# Patient Record
Sex: Female | Born: 1970 | Race: White | Hispanic: No | Marital: Married | Smoking: Never smoker
Health system: Southern US, Community
[De-identification: ages and names within clinical notes are randomized; demographics above are authoritative.]

## PROBLEM LIST (undated history)

## (undated) DIAGNOSIS — N979 Female infertility, unspecified: Secondary | ICD-10-CM

## (undated) DIAGNOSIS — I341 Nonrheumatic mitral (valve) prolapse: Secondary | ICD-10-CM

## (undated) DIAGNOSIS — N809 Endometriosis, unspecified: Secondary | ICD-10-CM

## (undated) DIAGNOSIS — C801 Malignant (primary) neoplasm, unspecified: Secondary | ICD-10-CM

## (undated) DIAGNOSIS — R87619 Unspecified abnormal cytological findings in specimens from cervix uteri: Secondary | ICD-10-CM

## (undated) DIAGNOSIS — E162 Hypoglycemia, unspecified: Secondary | ICD-10-CM

## (undated) DIAGNOSIS — T7840XA Allergy, unspecified, initial encounter: Secondary | ICD-10-CM

## (undated) DIAGNOSIS — R32 Unspecified urinary incontinence: Secondary | ICD-10-CM

## (undated) DIAGNOSIS — G5603 Carpal tunnel syndrome, bilateral upper limbs: Secondary | ICD-10-CM

## (undated) DIAGNOSIS — Z5189 Encounter for other specified aftercare: Secondary | ICD-10-CM

## (undated) DIAGNOSIS — G8 Cerebral palsy: Secondary | ICD-10-CM

## (undated) DIAGNOSIS — F419 Anxiety disorder, unspecified: Secondary | ICD-10-CM

## (undated) DIAGNOSIS — D649 Anemia, unspecified: Secondary | ICD-10-CM

## (undated) DIAGNOSIS — O43219 Placenta accreta, unspecified trimester: Secondary | ICD-10-CM

## (undated) HISTORY — PX: WISDOM TOOTH EXTRACTION: SHX21

## (undated) HISTORY — DX: Carpal tunnel syndrome, bilateral upper limbs: G56.03

## (undated) HISTORY — DX: Anemia, unspecified: D64.9

## (undated) HISTORY — DX: Allergy, unspecified, initial encounter: T78.40XA

## (undated) HISTORY — DX: Anxiety disorder, unspecified: F41.9

## (undated) HISTORY — PX: OTHER SURGICAL HISTORY: SHX169

## (undated) HISTORY — DX: Female infertility, unspecified: N97.9

## (undated) HISTORY — DX: Nonrheumatic mitral (valve) prolapse: I34.1

## (undated) HISTORY — DX: Endometriosis, unspecified: N80.9

## (undated) HISTORY — DX: Unspecified abnormal cytological findings in specimens from cervix uteri: R87.619

## (undated) HISTORY — DX: Hypoglycemia, unspecified: E16.2

## (undated) HISTORY — PX: PELVIC LAPAROSCOPY: SHX162

## (undated) HISTORY — PX: BARTHOLIN GLAND CYST EXCISION: SHX565

## (undated) HISTORY — DX: Encounter for other specified aftercare: Z51.89

## (undated) HISTORY — DX: Unspecified urinary incontinence: R32

## (undated) HISTORY — DX: Malignant (primary) neoplasm, unspecified: C80.1

---

## 1998-05-28 ENCOUNTER — Encounter: Admission: RE | Admit: 1998-05-28 | Discharge: 1998-08-26 | Payer: Self-pay | Admitting: *Deleted

## 1998-11-10 ENCOUNTER — Encounter: Payer: Self-pay | Admitting: Gynecology

## 1998-11-10 ENCOUNTER — Ambulatory Visit (HOSPITAL_COMMUNITY): Admission: RE | Admit: 1998-11-10 | Discharge: 1998-11-10 | Payer: Self-pay | Admitting: Gynecology

## 1998-12-04 HISTORY — PX: LAPAROSCOPIC ENDOMETRIOSIS FULGURATION: SUR769

## 1999-09-26 ENCOUNTER — Other Ambulatory Visit: Admission: RE | Admit: 1999-09-26 | Discharge: 1999-09-26 | Payer: Self-pay | Admitting: Obstetrics and Gynecology

## 2000-01-05 ENCOUNTER — Inpatient Hospital Stay (HOSPITAL_COMMUNITY): Admission: AD | Admit: 2000-01-05 | Discharge: 2000-01-05 | Payer: Self-pay | Admitting: Obstetrics and Gynecology

## 2000-01-07 ENCOUNTER — Inpatient Hospital Stay (HOSPITAL_COMMUNITY): Admission: AD | Admit: 2000-01-07 | Discharge: 2000-01-07 | Payer: Self-pay | Admitting: Obstetrics and Gynecology

## 2000-01-17 ENCOUNTER — Inpatient Hospital Stay (HOSPITAL_COMMUNITY): Admission: AD | Admit: 2000-01-17 | Discharge: 2000-01-17 | Payer: Self-pay | Admitting: Obstetrics and Gynecology

## 2000-02-03 ENCOUNTER — Inpatient Hospital Stay (HOSPITAL_COMMUNITY): Admission: AD | Admit: 2000-02-03 | Discharge: 2000-02-03 | Payer: Self-pay | Admitting: Obstetrics & Gynecology

## 2000-02-04 ENCOUNTER — Inpatient Hospital Stay (HOSPITAL_COMMUNITY): Admission: AD | Admit: 2000-02-04 | Discharge: 2000-02-04 | Payer: Self-pay | Admitting: Obstetrics and Gynecology

## 2000-02-08 ENCOUNTER — Encounter (INDEPENDENT_AMBULATORY_CARE_PROVIDER_SITE_OTHER): Payer: Self-pay

## 2000-02-08 ENCOUNTER — Inpatient Hospital Stay (HOSPITAL_COMMUNITY): Admission: AD | Admit: 2000-02-08 | Discharge: 2000-02-11 | Payer: Self-pay | Admitting: Obstetrics and Gynecology

## 2000-02-08 ENCOUNTER — Encounter: Payer: Self-pay | Admitting: Obstetrics and Gynecology

## 2000-02-12 ENCOUNTER — Encounter: Admission: RE | Admit: 2000-02-12 | Discharge: 2000-04-18 | Payer: Self-pay | Admitting: Obstetrics and Gynecology

## 2000-03-15 ENCOUNTER — Other Ambulatory Visit: Admission: RE | Admit: 2000-03-15 | Discharge: 2000-03-15 | Payer: Self-pay | Admitting: Obstetrics and Gynecology

## 2000-04-25 ENCOUNTER — Encounter: Admission: RE | Admit: 2000-04-25 | Discharge: 2000-07-24 | Payer: Self-pay | Admitting: Obstetrics and Gynecology

## 2001-03-26 ENCOUNTER — Other Ambulatory Visit: Admission: RE | Admit: 2001-03-26 | Discharge: 2001-03-26 | Payer: Self-pay | Admitting: Gynecology

## 2001-12-04 DIAGNOSIS — R87619 Unspecified abnormal cytological findings in specimens from cervix uteri: Secondary | ICD-10-CM

## 2001-12-04 DIAGNOSIS — Z5189 Encounter for other specified aftercare: Secondary | ICD-10-CM

## 2001-12-04 HISTORY — DX: Unspecified abnormal cytological findings in specimens from cervix uteri: R87.619

## 2001-12-04 HISTORY — DX: Encounter for other specified aftercare: Z51.89

## 2002-03-14 ENCOUNTER — Ambulatory Visit (HOSPITAL_COMMUNITY): Admission: RE | Admit: 2002-03-14 | Discharge: 2002-03-14 | Payer: Self-pay

## 2002-03-24 ENCOUNTER — Encounter (HOSPITAL_COMMUNITY): Admission: RE | Admit: 2002-03-24 | Discharge: 2002-04-23 | Payer: Self-pay | Admitting: Obstetrics and Gynecology

## 2002-03-31 ENCOUNTER — Inpatient Hospital Stay: Admission: AD | Admit: 2002-03-31 | Discharge: 2002-03-31 | Payer: Self-pay | Admitting: *Deleted

## 2002-04-04 ENCOUNTER — Inpatient Hospital Stay (HOSPITAL_COMMUNITY): Admission: AD | Admit: 2002-04-04 | Discharge: 2002-04-04 | Payer: Self-pay | Admitting: *Deleted

## 2002-04-18 ENCOUNTER — Inpatient Hospital Stay (HOSPITAL_COMMUNITY): Admission: AD | Admit: 2002-04-18 | Discharge: 2002-04-18 | Payer: Self-pay | Admitting: *Deleted

## 2002-04-25 ENCOUNTER — Encounter (HOSPITAL_COMMUNITY): Admission: RE | Admit: 2002-04-25 | Discharge: 2002-05-25 | Payer: Self-pay | Admitting: *Deleted

## 2002-04-25 ENCOUNTER — Encounter: Payer: Self-pay | Admitting: *Deleted

## 2002-05-24 ENCOUNTER — Encounter: Payer: Self-pay | Admitting: *Deleted

## 2002-05-24 ENCOUNTER — Inpatient Hospital Stay (HOSPITAL_COMMUNITY): Admission: AD | Admit: 2002-05-24 | Discharge: 2002-05-26 | Payer: Self-pay | Admitting: *Deleted

## 2002-05-26 ENCOUNTER — Inpatient Hospital Stay (HOSPITAL_COMMUNITY): Admission: AD | Admit: 2002-05-26 | Discharge: 2002-05-29 | Payer: Self-pay | Admitting: *Deleted

## 2002-06-03 ENCOUNTER — Encounter (HOSPITAL_COMMUNITY): Admission: RE | Admit: 2002-06-03 | Discharge: 2002-07-03 | Payer: Self-pay | Admitting: *Deleted

## 2002-06-15 ENCOUNTER — Inpatient Hospital Stay (HOSPITAL_COMMUNITY): Admission: AD | Admit: 2002-06-15 | Discharge: 2002-06-15 | Payer: Self-pay | Admitting: *Deleted

## 2002-07-08 ENCOUNTER — Encounter (HOSPITAL_COMMUNITY): Admission: RE | Admit: 2002-07-08 | Discharge: 2002-08-07 | Payer: Self-pay | Admitting: *Deleted

## 2002-07-22 ENCOUNTER — Encounter: Payer: Self-pay | Admitting: *Deleted

## 2002-08-01 ENCOUNTER — Inpatient Hospital Stay: Admission: AD | Admit: 2002-08-01 | Discharge: 2002-08-01 | Payer: Self-pay | Admitting: Obstetrics and Gynecology

## 2002-08-11 ENCOUNTER — Encounter: Payer: Self-pay | Admitting: *Deleted

## 2002-08-14 ENCOUNTER — Inpatient Hospital Stay (HOSPITAL_COMMUNITY): Admission: AD | Admit: 2002-08-14 | Discharge: 2002-08-14 | Payer: Self-pay | Admitting: *Deleted

## 2002-08-26 ENCOUNTER — Encounter: Payer: Self-pay | Admitting: *Deleted

## 2002-08-26 ENCOUNTER — Encounter (HOSPITAL_COMMUNITY): Admission: RE | Admit: 2002-08-26 | Discharge: 2002-09-25 | Payer: Self-pay | Admitting: *Deleted

## 2002-09-02 ENCOUNTER — Encounter: Payer: Self-pay | Admitting: *Deleted

## 2002-09-09 ENCOUNTER — Encounter: Payer: Self-pay | Admitting: *Deleted

## 2002-09-16 ENCOUNTER — Encounter: Payer: Self-pay | Admitting: *Deleted

## 2002-09-23 ENCOUNTER — Encounter: Payer: Self-pay | Admitting: *Deleted

## 2002-09-30 ENCOUNTER — Encounter (HOSPITAL_COMMUNITY): Admission: RE | Admit: 2002-09-30 | Discharge: 2002-10-10 | Payer: Self-pay | Admitting: *Deleted

## 2002-09-30 ENCOUNTER — Encounter: Payer: Self-pay | Admitting: *Deleted

## 2002-10-07 ENCOUNTER — Encounter: Payer: Self-pay | Admitting: *Deleted

## 2002-10-14 ENCOUNTER — Encounter: Payer: Self-pay | Admitting: *Deleted

## 2002-10-14 ENCOUNTER — Inpatient Hospital Stay (HOSPITAL_COMMUNITY): Admission: AD | Admit: 2002-10-14 | Discharge: 2002-10-19 | Payer: Self-pay | Admitting: *Deleted

## 2002-10-16 ENCOUNTER — Encounter (INDEPENDENT_AMBULATORY_CARE_PROVIDER_SITE_OTHER): Payer: Self-pay

## 2002-11-18 ENCOUNTER — Encounter (INDEPENDENT_AMBULATORY_CARE_PROVIDER_SITE_OTHER): Payer: Self-pay | Admitting: *Deleted

## 2002-11-18 ENCOUNTER — Inpatient Hospital Stay (HOSPITAL_COMMUNITY): Admission: AD | Admit: 2002-11-18 | Discharge: 2002-11-18 | Payer: Self-pay | Admitting: *Deleted

## 2002-12-11 ENCOUNTER — Encounter: Admission: RE | Admit: 2002-12-11 | Discharge: 2003-01-10 | Payer: Self-pay | Admitting: *Deleted

## 2004-03-16 ENCOUNTER — Other Ambulatory Visit: Admission: RE | Admit: 2004-03-16 | Discharge: 2004-03-16 | Payer: Self-pay | Admitting: Gynecology

## 2005-04-10 ENCOUNTER — Other Ambulatory Visit: Admission: RE | Admit: 2005-04-10 | Discharge: 2005-04-10 | Payer: Self-pay | Admitting: Gynecology

## 2005-04-17 ENCOUNTER — Encounter: Admission: RE | Admit: 2005-04-17 | Discharge: 2005-04-17 | Payer: Self-pay | Admitting: Internal Medicine

## 2005-12-25 ENCOUNTER — Emergency Department (HOSPITAL_COMMUNITY): Admission: EM | Admit: 2005-12-25 | Discharge: 2005-12-25 | Payer: Self-pay | Admitting: Emergency Medicine

## 2006-04-09 ENCOUNTER — Other Ambulatory Visit: Admission: RE | Admit: 2006-04-09 | Discharge: 2006-04-09 | Payer: Self-pay | Admitting: Gynecology

## 2007-03-27 ENCOUNTER — Other Ambulatory Visit: Admission: RE | Admit: 2007-03-27 | Discharge: 2007-03-27 | Payer: Self-pay | Admitting: Gynecology

## 2008-06-11 ENCOUNTER — Other Ambulatory Visit: Admission: RE | Admit: 2008-06-11 | Discharge: 2008-06-11 | Payer: Self-pay | Admitting: Gynecology

## 2008-10-01 ENCOUNTER — Ambulatory Visit: Payer: Self-pay | Admitting: Internal Medicine

## 2008-10-02 ENCOUNTER — Ambulatory Visit: Payer: Self-pay | Admitting: Internal Medicine

## 2008-11-19 ENCOUNTER — Ambulatory Visit: Payer: Self-pay | Admitting: Internal Medicine

## 2009-09-17 ENCOUNTER — Ambulatory Visit: Payer: Self-pay | Admitting: Internal Medicine

## 2009-10-15 ENCOUNTER — Ambulatory Visit: Payer: Self-pay | Admitting: Internal Medicine

## 2010-01-12 ENCOUNTER — Emergency Department (HOSPITAL_COMMUNITY): Admission: EM | Admit: 2010-01-12 | Discharge: 2010-01-12 | Payer: Self-pay | Admitting: Emergency Medicine

## 2010-03-25 ENCOUNTER — Ambulatory Visit: Payer: Self-pay | Admitting: Internal Medicine

## 2010-12-22 ENCOUNTER — Ambulatory Visit
Admission: RE | Admit: 2010-12-22 | Discharge: 2010-12-22 | Payer: Self-pay | Source: Home / Self Care | Attending: Internal Medicine | Admitting: Internal Medicine

## 2011-04-21 NOTE — Op Note (Signed)
[name], [name]                            ACCOUNT NO.:  [id number]   MEDICAL RECORD NO.:  [id number]                   PATIENT TYPE:  INP   LOCATION:  9179                                 FACILITY:  [facility]   PHYSICIAN:  Tanya S. Shawnie Pons, M.D.                DATE OF BIRTH:  03-15-1971   DATE OF PROCEDURE:  10/15/2002  DATE OF DISCHARGE:                                 OPERATIVE REPORT   PREOPERATIVE DIAGNOSIS:  Postpartum hemorrhage.   POSTOPERATIVE DIAGNOSIS:  Postpartum hemorrhage.   PROCEDURE:  Suction dilatation and curettage and second degree perineal tear  repair.   SURGEON:  Shelbie Proctor. Shawnie Pons, M.D.   ANESTHESIA:  Epidural and IV sedation.   FINDINGS:  A boggy uterus with retained placenta.   ESTIMATED BLOOD LOSS:  Approximately 4000 cc.   COMPLICATIONS:  Postpartum hemorrhage.   SPECIMENS:  Placenta to pathology.   INDICATION FOR PROCEDURE:  The patient is a 40 year old G2, P1, who is for  induction of labor for decreased fetal movement.  She received IV Pitocin  all day and progressed well to complete.  She pushed for approximately one  hour and had a spontaneous vaginal delivery of a viable female infant with  Apgars of 9 and 9, weight 6 pounds 1 ounce, over a second degree perineal  tear.  Post delivery the placenta began to separate and came out with gentle  traction.  The placenta delivered; however, it was felt that a portion of  placenta probably remained in, and the uterine cavity was explored and found  to have quite a bit of retained placenta that was markedly adherent to the  anterior wall.  Several attempts were made to remove the placenta manually.  When this failed, a curette was then passed in the delivery room several  times without much success in returning tissue.  The uterus was bimanually  massaged, and the patient was given IM Methergine in the room as well as  Pitocin.  A Foley was placed inside the room, and blood was ordered.  The  patient had  a second IV started and received approximately 3 L of fluid  while still in the delivery room.  The decision was then made due to  uncontrollable hemorrhage to go to the operating room.   DESCRIPTION OF PROCEDURE:  The patient was taken to the OR and placed in  dorsal lithotomy in Ida stirrups.  She was prepped and draped in the usual  sterile fashion and the uterus was massaged.  Brisk bleeding was noted, and  the anterior lip of the cervix was grasped with a ring forceps.  A sharp,  large curette was then passed through the cervix into the uterine cavity,  and the uterus was cleared of all retained placenta.  Gerri Spore B. Earlene Plater, M.D.,  actually did come in and did an ultrasound of the uterus and saw a  nice,  thin stripe, but the patient continued to bleed.  The patient then received  IM Hemabate as well as 400 of Cytotec.  The midline episiotomy was repaired  with some 2-0 Vicryl in a running fashion.  The uterus continued to be firm  but when massaged gave a large amount of blood that still appeared to be  clotting.  A second dose of Hemabate was given.  This went on for two  minutes longer, and another 200 of Cytotec was given per rectum.  Continual  massage was done and another dose of Hemabate given until the bleeding was  down to a fairly slow trickle.  The midline episiotomy repair was broken  down and had to be repaired again with a 2-0 Vicryl suture.  The patient  received four units of packed red blood cells intraoperatively and two units  of FFP in the PACU.  Her pulse went from the 150s down to the 120s, her  blood pressure came up from 70s/30s to 110s/50s at the time of transfer to  the PACU.  All instrument, needle, and lap counts were correct x2.  Any  tissue removed was sent to pathology.  The patient was taken to the recovery  room and transferred to the AICU in critical condition.                                               Shelbie Proctor. Shawnie Pons, M.D.    TSP/MEDQ  D:   10/15/2002  T:  10/16/2002  Job:  [id number]

## 2011-04-21 NOTE — Discharge Summary (Signed)
   [name], [name]                            ACCOUNT NO.:  [id number]   MEDICAL RECORD NO.:  [id number]                   PATIENT TYPE:   LOCATION:                                       [facility]ILITY:  [facility]   PHYSICIAN:  Conni Elliot, M.D.             DATE OF BIRTH:  07/24/1971   DATE OF ADMISSION:  05/23/2002  DATE OF DISCHARGE:  05/29/2002                                 DISCHARGE SUMMARY   HISTORY OF PRESENT ILLNESS:  A 40 year old gravida 2 para 0-1-0 at 33 to [redacted]  weeks gestation well dated by my first trimester assessment.  The patient  presents complaining of five to six contractions per hour.  There has been  no rupture of membranes and no bleeding.   PHYSICAL EXAMINATION ON ADMISSION:  VITAL SIGNS:  Temperature 99.1, pulse  77, respirations 20, blood pressure 108/73.  GENERAL:  The patient is a well-developed, well-nourished female.  HEART:  NSR.  LUNGS:  Clear.  CERVIX:  Long, fingertip external os, no pressure on stitch.  ABDOMEN:  Fetal heart rate was 150-160.   HOSPITAL COURSE:  The patient is 17 to 18 weeks and is admitted for  cervicitis and ascending infection.  The patient was placed on IV  antibiotics and Motrin.  Her baby aspirin was continued.  The patient did  require morphine injection once for pain; however, she responded to the  Motrin and antibiotics.  The Motrin was discontinued on May 28, 2002 and  she was felt to be ready for discharge on May 29, 2002.   DISPOSITION:  The patient was instructed to return home to bedrest and to  return to see Dr. Gavin Potters on June 03, 2002.                                               Conni Elliot, M.D.    ASG/MEDQ  D:  10/09/2002  T:  10/10/2002  Job:  [id number]

## 2011-04-21 NOTE — Discharge Summary (Signed)
NAMEBRISSIA, [name]                            ACCOUNT NO.:  [id number]   MEDICAL RECORD NO.:  [id number]                   PATIENT TYPE:  INP   LOCATION:  9131                                 [facility]ACILITY:  [facility]   PHYSICIAN:  Mary Sella. Orlene Erm, M.D.                 DATE OF BIRTH:  11-Jan-1971   DATE OF ADMISSION:  10/14/2002  DATE OF DISCHARGE:  10/19/2002                                 DISCHARGE SUMMARY   REASON FOR ADMISSION:  Induction of labor.   PRINCIPAL DIAGNOSES:  A 40 year old gravida 2, para 0-1-0-1 at 37 weeks and  2 days with decreased fetal activity.   ADDITIONAL DIAGNOSES:  1. History of preterm labor.  2. History of cervical cerclage.  3. Postpartum hemorrhage.  4. 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   HOSPITAL COURSE:  The patient is a 40 year old G2, P0-1-0-1 who presents to  labor and delivery at 37 weeks and 2 days.  Has been followed by Conni Elliot, M.D. for a history of preterm delivery.  She was admitted for  induction of labor secondary to decreased fetal activity.  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  It was removed approximately one week prior to admission.  The patient was admitted to labor and delivery and begun induction of labor  with Cervidil.  She progressed in labor with contractions up to 5-6 cm, 50%,  and -2.  She had artificial rupture of membranes performed.  IUPC was placed  and patient progressed in labor to complete complete and +1.  She began  pushing and delivered a viable female infant at 64 on December 15, 2001.  The placenta delivered spontaneously at 1847 and was noted to have tearing  of the placenta and retained placenta.  The patient was hemorrhaging and was  taken to the OR for dilatation and curettage.  The patient underwent  dilatation and curettage and received Hemabate, Methergine, and Cytotec  intraoperatively.  She received 4 units of packed red blood cells  and 2  units of FFP in the OR.  Estimated blood loss for delivery was approximately  4000 cubic centimeters.  The patient recovered in the adult intensive care  unit.  The patient continued to be anemic and was given 2 more units of  packed red blood cells.  Her bleeding was significantly less after her D&C.  The patient continued to do well and on postpartum day number two was  afebrile with vital signs stable.  At that point her hemoglobin was 5.7,  hematocrit 18.8, platelets 86,000.  At that point she received her fifth and  sixth unit of packed red blood cells.  Postpartum day number four the  patient was doing well with stable hemoglobin and hematocrit.  She was  discharged home in stable condition.   DISCHARGE DIET:  Regular.  DISCHARGE ACTIVITIES:  Pelvic rest.   DISCHARGE MEDICATIONS:  1. Vicodin.  2. Motrin.  3. Iron sulfate.   FOLLOW UP:  The patient was to follow up with Conni Elliot, M.D. in  approximately six weeks for postpartum examination.   LABORATORIES:  The day of discharge patient's white blood cell count was  7.4, hemoglobin 8.1, hematocrit 23.3, and platelets 133,000.   DISCHARGE DIAGNOSES:  1. Term pregnancy, delivered.  2. Placenta accreta.  3. Postpartum hemorrhage.  4. Anemia.                                               Mary Sella. Orlene Erm, M.D.    EMH/MEDQ  D:  12/18/2002  T:  12/18/2002  Job:  [id number]

## 2011-04-21 NOTE — Discharge Summary (Signed)
   [name], [name]                            ACCOUNT NO.:  [id number]   MEDICAL RECORD NO.:  [id number]                   PATIENT TYPE:  MAT   LOCATION:  [facility]C                                 [facility]ILITY:  [facility]   PHYSICIAN:  Conni Elliot, M.D.             DATE OF BIRTH:  Nov 04, 1971   DATE OF ADMISSION:  05/24/2002  DATE OF DISCHARGE:  05/26/2002                                 DISCHARGE SUMMARY   HISTORY OF PRESENT ILLNESS:  The patient was seen for a prenatal visit and  was found to have premature cervical change at 17-18 weeks with history of  prior premature birth.  The patient is currently on baby aspirin and  __________.   PHYSICAL EXAMINATION:  Weight 187, pulse 81, respirations 22, blood pressure  121/77.  The cervix on visual is external os 1, internal os fingertip with  palpable lower uterine segment.  GBS was negative.   HOSPITAL COURSE:  On May 16, 2002, the patient was admitted and placed on  Motrin, baby aspirin and IV Unasyn.  The patient was taken to the operating  room on May 25, 2002, and had a cervical cerclage.  The patient had an  uneventful postoperative course and was felt to be ready for discharge on  May 26, 2002.                                               Conni Elliot, M.D.    ASG/MEDQ  D:  08/24/2002  T:  08/25/2002  Job:  [id number]

## 2011-04-21 NOTE — Op Note (Signed)
   NAMEAILEENA, IGLESIA                            ACCOUNT NO.:  [id number]   MEDICAL RECORD NO.:  [id number]                   PATIENT TYPE:  NP   LOCATION:  9128                                 [facility]ACILITY:  [facility]   PHYSICIAN:  Conni Elliot, M.D.             DATE OF BIRTH:  [birth year]   DATE OF PROCEDURE:  05/25/2002  DATE OF DISCHARGE:  05/26/2002                                 OPERATIVE REPORT   PREOPERATIVE DIAGNOSIS:  Decreased cervical resistance.   POSTOPERATIVE DIAGNOSIS:  Decreased cervical resistance.   PROCEDURE:  McDonald cerclage.   SURGEON:  Conni Elliot, M.D.   ANESTHESIA:  Spinal.   ESTIMATED BLOOD LOSS:  Less than 5 cc.   DESCRIPTION OF PROCEDURE:  After placing the patient in the dorsal supine  after receiving a spinal anesthetic, the perineum and vagina were prepped  with multiple Betadine solution.  A Foley catheter was placed to straight  drainage, a weighted speculum was placed in the posterior vagina, and the  anterior cervix was grasped with a sponge stick.  After placing a Cleocin  douche, a cervical cerclage was placed starting at 12 o'clock using number 4  silk, double-stranded.  A suture was placed counterclockwise and tied  anterior at 12 o'clock.                                               Conni Elliot, M.D.    ASG/MEDQ  D:  07/01/2002  T:  07/03/2002  Job:  [id number]

## 2011-07-10 ENCOUNTER — Encounter: Payer: Self-pay | Admitting: Internal Medicine

## 2011-07-11 ENCOUNTER — Other Ambulatory Visit: Payer: Self-pay | Admitting: Internal Medicine

## 2011-07-11 ENCOUNTER — Other Ambulatory Visit: Payer: BC Managed Care – PPO | Admitting: Internal Medicine

## 2011-07-11 DIAGNOSIS — Z0000 Encounter for general adult medical examination without abnormal findings: Secondary | ICD-10-CM

## 2011-07-11 LAB — CBC WITH DIFFERENTIAL/PLATELET
Basophils Absolute: 0 10*3/uL (ref 0.0–0.1)
Eosinophils Absolute: 0.1 10*3/uL (ref 0.0–0.7)
Eosinophils Relative: 1 % (ref 0–5)
HCT: 43.9 % (ref 36.0–46.0)
MCH: 29.5 pg (ref 26.0–34.0)
MCV: 93.2 fL (ref 78.0–100.0)
Monocytes Absolute: 0.4 10*3/uL (ref 0.1–1.0)
Platelets: 172 10*3/uL (ref 150–400)
RDW: 13.3 % (ref 11.5–15.5)

## 2011-07-11 LAB — TSH: TSH: 1.231 u[IU]/mL (ref 0.350–4.500)

## 2011-07-12 LAB — COMPREHENSIVE METABOLIC PANEL
ALT: 8 U/L (ref 0–35)
AST: 18 U/L (ref 0–37)
CO2: 26 mEq/L (ref 19–32)
Chloride: 104 mEq/L (ref 96–112)
Creat: 0.63 mg/dL (ref 0.50–1.10)
Sodium: 137 mEq/L (ref 135–145)
Total Bilirubin: 0.8 mg/dL (ref 0.3–1.2)
Total Protein: 6.2 g/dL (ref 6.0–8.3)

## 2011-07-12 LAB — VITAMIN D 25 HYDROXY (VIT D DEFICIENCY, FRACTURES): Vit D, 25-Hydroxy: 42 ng/mL (ref 30–89)

## 2011-07-12 LAB — LIPID PANEL
HDL: 50 mg/dL (ref >39)
LDL Cholesterol: 50 mg/dL (ref 0–99)
Total CHOL/HDL Ratio: 2.3 Ratio

## 2011-07-13 ENCOUNTER — Ambulatory Visit (INDEPENDENT_AMBULATORY_CARE_PROVIDER_SITE_OTHER): Payer: BC Managed Care – PPO | Admitting: Internal Medicine

## 2011-07-13 ENCOUNTER — Encounter: Payer: Self-pay | Admitting: Internal Medicine

## 2011-07-13 VITALS — BP 119/69 | HR 72 | Temp 98.7°F | Ht 64.5 in | Wt 129.0 lb

## 2011-07-13 DIAGNOSIS — R5381 Other malaise: Secondary | ICD-10-CM

## 2011-07-13 DIAGNOSIS — I059 Rheumatic mitral valve disease, unspecified: Secondary | ICD-10-CM

## 2011-07-13 DIAGNOSIS — R5383 Other fatigue: Secondary | ICD-10-CM

## 2011-07-13 DIAGNOSIS — G43909 Migraine, unspecified, not intractable, without status migrainosus: Secondary | ICD-10-CM

## 2011-07-13 DIAGNOSIS — Z0000 Encounter for general adult medical examination without abnormal findings: Secondary | ICD-10-CM

## 2011-07-13 DIAGNOSIS — F411 Generalized anxiety disorder: Secondary | ICD-10-CM

## 2011-07-13 DIAGNOSIS — F419 Anxiety disorder, unspecified: Secondary | ICD-10-CM

## 2011-07-13 DIAGNOSIS — I341 Nonrheumatic mitral (valve) prolapse: Secondary | ICD-10-CM

## 2011-07-13 LAB — POCT URINALYSIS DIPSTICK
Bilirubin, UA: NEGATIVE
Blood, UA: NEGATIVE
Glucose, UA: NEGATIVE
Ketones, UA: NEGATIVE
Spec Grav, UA: 1.01
pH, UA: 5

## 2011-07-13 LAB — HEMOGLOBIN A1C: Mean Plasma Glucose: 111 mg/dL (ref <117)

## 2011-07-14 ENCOUNTER — Encounter: Payer: Self-pay | Admitting: Internal Medicine

## 2011-07-14 DIAGNOSIS — G43909 Migraine, unspecified, not intractable, without status migrainosus: Secondary | ICD-10-CM | POA: Insufficient documentation

## 2011-07-14 DIAGNOSIS — I341 Nonrheumatic mitral (valve) prolapse: Secondary | ICD-10-CM | POA: Insufficient documentation

## 2011-07-14 DIAGNOSIS — R5383 Other fatigue: Secondary | ICD-10-CM | POA: Insufficient documentation

## 2011-07-14 DIAGNOSIS — F419 Anxiety disorder, unspecified: Secondary | ICD-10-CM | POA: Insufficient documentation

## 2011-07-14 NOTE — Progress Notes (Signed)
  Subjective:    Patient ID: [name], female    DOB: 1971/09/21, 40 y.o.   MRN: [id number]  HPI  pleasant 40 year old white female with history of mitral valve prolapse, migraine headaches, bilateral carpal tunnel syndrome for health maintenance exam. Patient says that she has been fatigued and doesn't feel able to focus for many activities. 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 Has oldest daughter who has cerebral palsy but does well in school. Younger daughter is fine. He admits it stressful coordinating all of their activities. Husband is supportive. Patient wonders if she might have attention deficit disorder. She graduated from [employer] and admits that she would procrastinate when it came to studying for tests and doing projects. Was always able to get everything done & do fairly well in school. Doesn't think she is depressed but a bit overwhelmed at times. Says she has a bit of a dysthymic mood.    Review of Systems  Constitutional: Positive for fatigue. Negative for fever, chills, diaphoresis and unexpected weight change.  HENT: Negative.   Eyes: Negative.   Respiratory: Negative.   Cardiovascular: Negative.   Gastrointestinal: Negative.   Genitourinary: Negative.   Musculoskeletal: Negative.   Neurological: Negative.   Hematological: Negative.   Psychiatric/Behavioral: Positive for dysphoric mood and decreased concentration.       Objective:   Physical Exam  Vitals reviewed. Constitutional: She is oriented to person, place, and time. No distress.  HENT:  Head: Normocephalic and atraumatic.  Right Ear: External ear normal.  Left Ear: External ear normal.  Mouth/Throat: Oropharynx is clear and moist. No oropharyngeal exudate.  Eyes: EOM are normal. Pupils are equal, round,  and reactive to light. Right eye exhibits no discharge. Left eye exhibits no discharge. No scleral icterus.  Neck: Neck supple. No JVD present. No thyromegaly present.  Cardiovascular: Normal rate, regular rhythm and normal heart sounds.   No murmur heard.      No click appreciated  Pulmonary/Chest: Effort normal and breath sounds normal. No respiratory distress. She has no wheezes. She has no rales.  Abdominal: Soft. Bowel sounds are normal. She exhibits no distension and no mass. There is no tenderness. There is no rebound.  Genitourinary:       Deferred to GYN  Musculoskeletal: Normal range of motion. She exhibits no edema.  Lymphadenopathy:    She has no cervical adenopathy.  Neurological: She is alert and oriented to person, place, and time. She has normal reflexes. No cranial nerve deficit.  Skin: No rash noted.  Psychiatric: She has a normal mood and affect. Her behavior is normal. Judgment and thought content normal.          Assessment & Plan:  Fatigue  Possible attention deficit disorder  Anxiety  Patient will be started on Wellbutrin XL 150 mg daily. We will reassess her in 6 weeks. Fasting labs discussed with her and are within normal limits including TSH.

## 2011-08-25 ENCOUNTER — Telehealth: Payer: Self-pay | Admitting: Internal Medicine

## 2011-08-25 ENCOUNTER — Ambulatory Visit: Payer: BC Managed Care – PPO | Admitting: Internal Medicine

## 2011-08-25 NOTE — Telephone Encounter (Signed)
Called patient to advise her she needed to see Dr. Lenord Fellers today.  She had canceled the appt earlier today.  Had to leave a voice mail.  Will get her in to see the doctor today if she calls back.

## 2011-08-25 NOTE — Telephone Encounter (Signed)
Pt needs to keep her appointment to discuss options. Please call her back and relay this message.

## 2011-08-28 ENCOUNTER — Ambulatory Visit (INDEPENDENT_AMBULATORY_CARE_PROVIDER_SITE_OTHER): Payer: BC Managed Care – PPO | Admitting: Internal Medicine

## 2011-08-28 ENCOUNTER — Encounter: Payer: Self-pay | Admitting: Internal Medicine

## 2011-08-28 VITALS — BP 120/70 | HR 72 | Temp 97.5°F | Ht 65.0 in | Wt 129.0 lb

## 2011-08-28 DIAGNOSIS — Z23 Encounter for immunization: Secondary | ICD-10-CM

## 2011-08-28 DIAGNOSIS — R3915 Urgency of urination: Secondary | ICD-10-CM

## 2011-08-28 DIAGNOSIS — R41840 Attention and concentration deficit: Secondary | ICD-10-CM

## 2011-08-28 LAB — POCT URINALYSIS DIPSTICK
Bilirubin, UA: NEGATIVE
Blood, UA: NEGATIVE
Glucose, UA: NEGATIVE
Ketones, UA: NEGATIVE
Leukocytes, UA: NEGATIVE
Nitrite, UA: NEGATIVE
Protein, UA: NEGATIVE
Spec Grav, UA: 1
Urobilinogen, UA: NEGATIVE
pH, UA: 5

## 2011-08-28 MED ORDER — TETANUS-DIPHTH-ACELL PERTUSSIS 5-2.5-18.5 LF-MCG/0.5 IM SUSP
0.5000 mL | Freq: Once | INTRAMUSCULAR | Status: AC
  Administered 2011-08-28: 0.5 mL via INTRAMUSCULAR

## 2011-08-28 NOTE — Progress Notes (Signed)
  Subjective:    Patient ID: [name], female    DOB: Apr 11, 1971, 40 y.o.   MRN: [id number]  HPI Patient seen recently to discuss difficulty focusing. She is a busy mother. Has a special needs child. 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 Has a lot to manage. I was a bit reluctant to try her on attention deficit disorder medication at last visit. We decided we would try Wellbutrin XL 150 mg daily and she started out taking one half tablet daily initially at my suggestion. Subsequently developed palpitations which she has had previously. She stopped taking will return in about a week ago and the palpitations resolved so this seems like there was a correlation. Would like to try another medication. We talked at length about her situation. She has a history of mitral valve prolapse and is seeing cardiologist in the past. It is possible that attention deficit disorder medication such as Adderall will also cause palpitations. Patient also has some urinary tract infection symptoms. She requested a urinalysis be checked.    Review of Systems     Objective:   Physical Exam no CVA tenderness; chest clear; cardiac exam regular rate and rhythm        Assessment & Plan:  Attention deficit disorder  Dysuria  History of mitral valve prolapse  Prescription for Adderall XR 10 mg daily (#30) 1 by mouth daily. Patient is to call in 2 weeks with progress report. An alternative might would be Concerta.

## 2011-11-16 ENCOUNTER — Encounter: Payer: Self-pay | Admitting: Internal Medicine

## 2011-11-16 ENCOUNTER — Ambulatory Visit (INDEPENDENT_AMBULATORY_CARE_PROVIDER_SITE_OTHER): Payer: BC Managed Care – PPO | Admitting: Internal Medicine

## 2011-11-16 VITALS — BP 110/70 | HR 70 | Temp 97.1°F | Resp 20 | Wt 133.0 lb

## 2011-11-16 DIAGNOSIS — J069 Acute upper respiratory infection, unspecified: Secondary | ICD-10-CM

## 2011-11-20 ENCOUNTER — Telehealth: Payer: Self-pay | Admitting: Internal Medicine

## 2011-11-20 MED ORDER — AZITHROMYCIN 250 MG PO TABS: ORAL_TABLET | ORAL | Status: AC

## 2011-11-20 NOTE — Telephone Encounter (Signed)
Refill Zithromax Z pak and start regimen all over. Call if not better in one week. MJB

## 2011-12-03 ENCOUNTER — Encounter: Payer: Self-pay | Admitting: Internal Medicine

## 2011-12-03 NOTE — Patient Instructions (Signed)
Take Zithromax 2 tablets day one followed by 1 tablet days 2 through 5. Call if not better in one week.

## 2011-12-03 NOTE — Progress Notes (Signed)
  Subjective:    Patient ID: [name], female    DOB: [birth year], 40 y.o.   MRN: [id number]  HPI complaining of URI symptoms. Some cough and congestion. No fever. No flulike symptoms.    Review of Systems     Objective:   Physical Exam pharynx very slightly injected; neck supple without adenopathy; TMs are clear; chest is clear; boggy nasal mucosa        Assessment & Plan:  URI  Plan: Zithromax Z-Pak take 2 tablets by mouth day one followed by 1 tablet by mouth days 2 through 5.

## 2012-02-05 ENCOUNTER — Other Ambulatory Visit: Payer: Self-pay | Admitting: Gynecology

## 2012-02-05 DIAGNOSIS — R928 Other abnormal and inconclusive findings on diagnostic imaging of breast: Secondary | ICD-10-CM

## 2012-02-07 ENCOUNTER — Ambulatory Visit
Admission: RE | Admit: 2012-02-07 | Discharge: 2012-02-07 | Disposition: A | Discharge status: Home / Self Care | Payer: BC Managed Care – PPO | Source: Ambulatory Visit | Attending: Gynecology | Admitting: Gynecology

## 2012-02-07 DIAGNOSIS — R928 Other abnormal and inconclusive findings on diagnostic imaging of breast: Secondary | ICD-10-CM

## 2012-07-29 ENCOUNTER — Ambulatory Visit (INDEPENDENT_AMBULATORY_CARE_PROVIDER_SITE_OTHER): Payer: BC Managed Care – PPO | Admitting: Internal Medicine

## 2012-07-29 ENCOUNTER — Encounter: Payer: Self-pay | Admitting: Internal Medicine

## 2012-07-29 ENCOUNTER — Telehealth: Payer: Self-pay | Admitting: Internal Medicine

## 2012-07-29 DIAGNOSIS — F988 Other specified behavioral and emotional disorders with onset usually occurring in childhood and adolescence: Secondary | ICD-10-CM

## 2012-07-29 DIAGNOSIS — H109 Unspecified conjunctivitis: Secondary | ICD-10-CM

## 2012-07-29 MED ORDER — OFLOXACIN 0.3 % OP SOLN: 2.0000 [drp] | Freq: Four times a day (QID) | OPHTHALMIC | Status: AC

## 2012-07-29 NOTE — Patient Instructions (Addendum)
Use Ofloxacin ophthalmic drops 2 drops in each eye 4 times daily for 5-7 days. Referral made to dr. Madaline Guthrie at your request to evaluate for Attention problems.

## 2012-07-29 NOTE — Progress Notes (Signed)
  Subjective:    Patient ID: [name], female    DOB: 21-Jun-1971, 41 y.o.   MRN: [id number]  HPI Onset yesterday of conjunctivitis symptoms. Awakened with redness of the eyes, scratchiness, crustiness and corners of eyes. No fever. One of her children recently had a bout of conjunctivitis and she used some prescription ophthalmic solution prescribed for her child. Says symptoms are better today. However she is out of that prescription and came in for evaluation.  Also, patient never had Adderall prescription filled from previous visit this past spring. She says she has a history of attention issues. Patient says she used to see [name], psychiatrist and would like to be referred back to her. She understands [name] is not taking new patients except upon referral. We have called her office and we'll be faxing some information on the patient over to her.    Review of Systems     Objective:   Physical Exam conjunctivae injected bilaterally. No drainage and corners of eyes. Extraocular movements are full. PERRLA. Spoke with patient for 10 minutes regarding attention issues.        Assessment & Plan:  Bilateral conjunctivitis  Attention deficit disorder  Plan: Ofloxacin ophthalmic drops 2 drops in each eye 4 times a day for 5-7 days with one refill. Referral to [name] to evaluate attention deficit issues.

## 2012-07-30 ENCOUNTER — Telehealth: Payer: Self-pay | Admitting: Internal Medicine

## 2012-10-01 ENCOUNTER — Telehealth: Payer: Self-pay | Admitting: Cardiology

## 2012-10-01 NOTE — Telephone Encounter (Signed)
Has history of palpitations, been a lot better lately per patient. Will request chart for  Dr. Patty Sermons to review

## 2012-10-01 NOTE — Telephone Encounter (Signed)
New Problem:    Patient called in wanting to know if she would be ok with taking a stimulant for her ADD.  Please call back.

## 2012-10-04 NOTE — Telephone Encounter (Signed)
Dr. Patty Sermons reviewed chart, ok to try.  Left message for patient to call back and leave message she received

## 2012-10-30 ENCOUNTER — Telehealth: Payer: Self-pay | Admitting: Internal Medicine

## 2012-10-30 ENCOUNTER — Ambulatory Visit: Payer: BC Managed Care – PPO | Admitting: Internal Medicine

## 2012-10-30 NOTE — Telephone Encounter (Signed)
If she doesn't feel better.  Advised patient we will be closed on Friday, 11/29.  Patient states she really just doesn't want to go on antibiotics.  She'll take Zyrtec or something and hope that she feels better.

## 2012-11-29 ENCOUNTER — Encounter: Payer: Self-pay | Admitting: Cardiology

## 2014-03-18 ENCOUNTER — Encounter (HOSPITAL_COMMUNITY): Payer: Self-pay | Admitting: Emergency Medicine

## 2014-03-18 ENCOUNTER — Emergency Department (HOSPITAL_COMMUNITY)
Admission: EM | Admit: 2014-03-18 | Discharge: 2014-03-18 | Disposition: A | Discharge status: Home / Self Care | Payer: BC Managed Care – PPO | Source: Home / Self Care

## 2014-03-18 ENCOUNTER — Emergency Department (INDEPENDENT_AMBULATORY_CARE_PROVIDER_SITE_OTHER): Payer: BC Managed Care – PPO

## 2014-03-18 DIAGNOSIS — S92919A Unspecified fracture of unspecified toe(s), initial encounter for closed fracture: Secondary | ICD-10-CM

## 2014-03-18 DIAGNOSIS — W2203XA Walked into furniture, initial encounter: Secondary | ICD-10-CM

## 2014-03-18 DIAGNOSIS — S92501A Displaced unspecified fracture of right lesser toe(s), initial encounter for closed fracture: Secondary | ICD-10-CM

## 2014-03-18 NOTE — ED Notes (Signed)
Right little toe bruising and pain, accidentally kicked bed post this am.  Reports little toe pointing to side and felt nauseated, and pain has continued

## 2014-03-18 NOTE — ED Provider Notes (Signed)
CSN: [id number]     Arrival date & time 03/18/14  3664 History   First MD Initiated Contact with Patient 03/18/14 1015     Chief Complaint  Patient presents with  . Toe Pain   (Consider location/radiation/quality/duration/timing/severity/associated sxs/prior Treatment) HPI Comments: Stumped right little toe against the bedpost this AM c/o localized pain and bruising to base of the toe. No other digits injured.   Past Medical History  Diagnosis Date  . Migraine   . Mitral valve prolapse   . Bilateral carpal tunnel syndrome    Past Surgical History  Procedure Laterality Date  . Laparoscopic endometriosis fulguration  2000   No family history on file. History  Substance Use Topics  . Smoking status: Never Smoker   . Smokeless tobacco: Not on file  . Alcohol Use: No   OB History   Grav Para Term Preterm Abortions TAB SAB Ect Mult Living                 Review of Systems  Musculoskeletal:       As per HPI. Toe hurts when ambulating  Skin: Positive for color change.  All other systems reviewed and are negative.   Allergies  Sulfa antibiotics  Home Medications   Prior to Admission medications   Not on File   BP 135/77  Pulse 59  Temp(Src) 97.6 F (36.4 C) (Oral)  Resp 16  SpO2 100%  LMP 03/11/2014 Physical Exam  Nursing note and vitals reviewed. Constitutional: She is oriented to person, place, and time. She appears well-developed and well-nourished. No distress.  Cardiovascular: Normal rate.   Pulmonary/Chest: Effort normal. No respiratory distress.  Musculoskeletal:  Tenderness to the proximal phalynx of the 5th toe. Mild local swelling and ecchymosis. No foot pain or tenderness. Distal N/V, M/S intact. Pedal pulse 2+  Neurological: She is alert and oriented to person, place, and time. She exhibits normal muscle tone.  Skin: Skin is warm and dry.  Psychiatric: She has a normal mood and affect.    ED Course  Procedures (including critical care time) Labs  Review Labs Reviewed - No data to display  Results for orders placed in visit on 08/28/11  POCT URINALYSIS DIPSTICK      Result Value Ref Range   Color, UA yellow     Clarity, UA clear     Glucose, UA neg     Bilirubin, UA neg     Ketones, UA neg     Spec Grav, UA 1.000     Blood, UA neg     pH, UA 5.0     Protein, UA neg     Urobilinogen, UA neg     Nitrite, UA neg     Leukocytes, UA neg     Imaging Review Dg Foot Complete Right  03/18/2014   CLINICAL DATA:  Pain post trauma  EXAM: RIGHT FOOT COMPLETE - 3+ VIEW  COMPARISON:  None.  FINDINGS: Frontal, oblique, and lateral views were obtained. There is an obliquely oriented fracture through the distal aspect of the fifth proximal phalanx with slight lateral displacement distally. No other fracture. No dislocation. Joint spaces appear intact.  IMPRESSION: Obliquely oriented fracture distal aspect fifth proximal phalanx.   Electronically Signed   By: Lowella Grip M.D.   On: 03/18/2014 10:42     MDM   1. Closed fracture of fifth toe of right foot    Buddy tape toes Post op shoe  For 3 weeks Ice elevation F/U  with ortho as above     Janne Napoleon, NP 03/18/14 1118

## 2014-03-18 NOTE — Discharge Instructions (Signed)
Buddy Taping of Toes We have taped your toes together to keep them from moving. This is called "buddy taping" since we used a part of your own body to keep the injured part still. We placed soft padding between your toes to keep them from rubbing against each other. Buddy taping will help with healing and to reduce pain. Keep your toes buddy taped together for as long as directed by your caregiver. HOME CARE INSTRUCTIONS   Raise your injured area above the level of your heart while sitting or lying down. Prop it up with pillows.  An ice pack used every twenty minutes, while awake, for the first one to two days may be helpful. Put ice in a plastic bag and put a towel between the bag and your skin.  Watch for signs that the taping is too tight. These signs may be:  Numbness of your taped toes.  Coolness of your taped toes.  Color change in the area beyond the tape.  Increased pain.  If you have any of these signs, loosen or rewrap the tape. If you need to loosen or rewrap the buddy tape, make sure you use the padding again. SEEK IMMEDIATE MEDICAL CARE IF:   You have worse pain, swelling, inflammation (soreness), drainage or bleeding after you rewrap the tape.  Any new problems occur. MAKE SURE YOU:   Understand these instructions.  Will watch your condition.  Will get help right away if you are not doing well or get worse. Document Released: 08/24/2004 Document Revised: 02/12/2012 Document Reviewed: 11/17/2008 [facility] Patient Information 2014 [place].  Toe Fracture Your caregiver has diagnosed you as having a fractured toe. A toe fracture is a break in the bone of a toe. "Buddy taping" is a way of splinting your broken toe, by taping the broken toe to the toe next to it. This "buddy taping" will keep the injured toe from moving beyond normal range of motion. Buddy taping also helps the toe heal in a more normal alignment. It may take 6 to 8 weeks for the toe injury to  heal. [place] your toes taped together for as long as directed by your caregiver or until you see a doctor for a follow-up examination. You can change the tape after bathing. Always use a small piece of gauze or cotton between the toes when taping them together. This will help the skin stay dry and prevent infection.  Apply ice to the injury for 15-20 minutes each hour while awake for the first 2 days. Put the ice in a plastic bag and place a towel between the bag of ice and your skin.  After the first 2 days, apply heat to the injured area. Use heat for the next 2 to 3 days. Place a heating pad on the foot or soak the foot in warm water as directed by your caregiver.  Keep your foot elevated as much as possible to lessen swelling.  Wear sturdy, supportive shoes. The shoes should not pinch the toes or fit tightly against the toes.  Your caregiver may prescribe a rigid shoe if your foot is very swollen.  Your may be given crutches if the pain is too great and it hurts too much to walk.  Only take over-the-counter or prescription medicines for pain, discomfort, or fever as directed by your caregiver.  If your caregiver has given you a follow-up appointment, it is very important to keep that appointment. Not keeping the appointment  could result in a chronic or permanent injury, pain, and disability. If there is any problem keeping the appointment, you must call back to this facility for assistance. SEEK MEDICAL CARE IF:   You have increased pain or swelling, not relieved with medications.  The pain does not get better after 1 week.  Your injured toe is cold when the others are warm. SEEK IMMEDIATE MEDICAL CARE IF:   The toe becomes cold, numb, or white.  The toe becomes hot (inflamed) and red. Document Released: 11/17/2000 Document Revised: 02/12/2012 Document Reviewed: 07/06/2008 [facility] Patient Information 2014 [place].  Toe Fracture  with Rehab A  fracture is a break in the bone that can be either partial or complete. Fractures of the toe bones may or may not include the joints that separate the bones. SYMPTOMS   Severe pain over the fracture site at the time of injury that may persist for an extend period of time.  Pain, tenderness, inflammation, and/or bruising (contusion) over the fracture site.  Visible deformity, if the bone fragments are not properly aligned (displaced fracture).  Signs of vascular damage: numbness or coldness (uncommon). CAUSES  Toe fractures occur when a force is placed on the bone that is greater than it can withstand.  Direct hit (trauma) to the toe.  Indirect trauma to the toe, such as forcefully pivoting on a planted foot. RISK INCREASES WITH:  Performing activities barefoot (i.e. ballet, gymnastics).  Wearing shoes with little support or protection.  Sports with cleats (i.e. football, rugby, lacrosse, soccer).  Bone disease (i.e. osteoporosis, bone tumors). PREVENTION   Wear properly fitted and protective shoes.  Protect previously injured toes with tape or padding. PROGNOSIS  If treated properly, toe fractures usually heal within 4 to 6 weeks. RELATED COMPLICATIONS   Failure of the fracture to heal (nonunion).  Healing of the fracture in a poor position (malunion).  Recurring symptoms.  Recurring symptoms that result in a chronic problem.  Excessive bleeding, causing pressure on nerves and blood vessels (rare).  Arthritis of the affected joints.  Stopping of bone growth in children.  Infection in fractures where the skin is broken over the fracture (open fracture).  Shortening of injured bones. TREATMENT  Treatment first involves the use of ice and medicine to reduce pain and inflammation. The toe should be restrained for a period of time to allow for healing, usually about 4 weeks. Your caregiver may advise wearing a hard-soled shoe to minimize stress on the healing bone.  Surgery is uncommon for this injury, but may be necessary if the fracture is severely displaced or if the bone pushes through the skin. Surgery typically involves the use of screws, pins, and/or plates to hold the fracture in place. After surgery, restraint of the foot is necessary. MEDICATION   If pain medicine is necessary, nonsteroidal anti-inflammatory medications (aspirin and ibuprofen), or other minor pain relievers (acetaminophen), are often recommended.  Do not take pain medicine for 7 days before surgery.  Prescription pain relievers may be given if your caregiver thinks they are needed. Use only as directed and only as much as you need. COLD THERAPY  Cold treatment (icing) relieves pain and reduces inflammation. Cold treatment should be applied for 10 to 15 minutes every 2 to 3 hours, and immediately after activity that aggravates your symptoms. Use ice packs or an ice massage. SEEK MEDICAL CARE IF:   Treatment does not seem to help, or the condition gets worse.  Any medicines produce negative side  effects.  Any complications from surgery occur:  Pain, numbness, or coldness in the affected foot.  Discoloration beneath the toenails (blue or gray) of the affected foot.  Signs of infection (fever, pain, inflammation, redness, or persistent bleeding). EXERCISES RANGE OF MOTION (ROM) AND STRETCHING EXERCISES - Toe Fracture (Phalangeal) These exercises may help you when beginning to rehabilitate your injury. Your symptoms may resolve with or without further involvement from your physician, physical therapist or athletic trainer. While completing these exercises, remember:   Restoring tissue flexibility helps normal motion to return to the joints. This allows healthier, less painful movement and activity.  An effective stretch should be held for at least 30 seconds.  A stretch should never be painful. You should only feel a gentle lengthening or release in the stretched  tissue. RANGE OF MOTION - Dorsi/Plantar Flexion  While sitting with your right / left knee straight, draw the top of your foot upwards by flexing your ankle. Then reverse the motion, pointing your toes downward.  Hold each position for __________ seconds.  After completing your first set of exercises, repeat this exercise with your knee bent. Repeat __________ times. Complete this exercise __________ times per day.  RANGE OF MOTION - Ankle Alphabet Imagine your right / left big toe is a pen. Keeping your hip and knee still, write out the entire alphabet with your "pen." Make the letters as large as you can without increasing any discomfort. Repeat __________ times. Complete this exercise __________ times per day.  RANGE OF MOTION - Toe Extension, Flexion  Sit with your right / left leg crossed over your opposite knee.  Grasp your toes and gently pull them back toward the top of your foot. You should feel a stretch on the bottom of your toes and foot.  Hold this stretch for __________ seconds.  Now, gently pull your toes toward the bottom of your foot. You should feel a stretch on the top of your toes and foot.  Hold this stretch for __________ seconds. Repeat __________ times. Complete this stretch__________ times per day.  STRENGTHENING EXERCISES - Toe Fracture (Phalangeal) These exercises may help you when beginning to rehabilitate your injury. They may resolve your symptoms with or without further involvement from your physician, physical therapist or athletic trainer. While completing these exercises, remember:   Muscles can gain both the endurance and the strength needed for everyday activities through controlled exercises.  Complete these exercises as instructed by your physician, physical therapist or athletic trainer. Increase the resistance and repetitions only as guided.  You may experience muscle soreness or fatigue, but the pain or discomfort you are trying to eliminate  should never worsen during these exercises. If this pain does get worse, stop and make sure you are following the directions exactly. If the pain is still present after adjustments, discontinue the exercise until you can discuss the trouble with your clinician. STRENGTH - Towel Curls  Sit in a chair, on a non-carpeted surface.  Place your foot on a towel, keeping your heel on the floor.  Pull the towel toward your heel only by curling your toes. Keep your heel on the floor.  If instructed by your physician, physical therapist or athletic trainer, add ____________________ at the end of the towel. Repeat __________ times. Complete this exercise __________ times per day. Document Released: 11/20/2005 Document Revised: 02/12/2012 Document Reviewed: 03/04/2009 [facility] Patient Information 2014 [place], [place].

## 2014-03-19 NOTE — ED Provider Notes (Signed)
Medical screening examination/treatment/procedure(s) were performed by non-physician practitioner and as supervising physician I was immediately available for consultation/collaboration.  Philipp Deputy, M.D.  Harden Mo, MD 03/19/14 1350

## 2014-08-13 ENCOUNTER — Other Ambulatory Visit: Payer: Self-pay | Admitting: Gynecology

## 2014-08-13 DIAGNOSIS — R928 Other abnormal and inconclusive findings on diagnostic imaging of breast: Secondary | ICD-10-CM

## 2014-08-20 ENCOUNTER — Ambulatory Visit
Admission: RE | Admit: 2014-08-20 | Discharge: 2014-08-20 | Disposition: A | Discharge status: Home / Self Care | Payer: BC Managed Care – PPO | Source: Ambulatory Visit | Attending: Gynecology | Admitting: Gynecology

## 2014-08-20 ENCOUNTER — Encounter (INDEPENDENT_AMBULATORY_CARE_PROVIDER_SITE_OTHER): Payer: Self-pay

## 2014-08-20 DIAGNOSIS — R928 Other abnormal and inconclusive findings on diagnostic imaging of breast: Secondary | ICD-10-CM

## 2014-12-14 ENCOUNTER — Other Ambulatory Visit: Payer: BLUE CROSS/BLUE SHIELD | Admitting: Internal Medicine

## 2014-12-14 DIAGNOSIS — Z0000 Encounter for general adult medical examination without abnormal findings: Secondary | ICD-10-CM

## 2014-12-14 DIAGNOSIS — Z13220 Encounter for screening for lipoid disorders: Secondary | ICD-10-CM

## 2014-12-14 DIAGNOSIS — Z1321 Encounter for screening for nutritional disorder: Secondary | ICD-10-CM

## 2014-12-14 DIAGNOSIS — Z1329 Encounter for screening for other suspected endocrine disorder: Secondary | ICD-10-CM

## 2014-12-14 DIAGNOSIS — Z130 Encounter for screening for diseases of the blood and blood-forming organs and certain disorders involving the immune mechanism: Secondary | ICD-10-CM

## 2014-12-14 LAB — COMPREHENSIVE METABOLIC PANEL
ALK PHOS: 55 U/L (ref 39–117)
ALT: 8 U/L (ref 0–35)
AST: 16 U/L (ref 0–37)
Albumin: 4.4 g/dL (ref 3.5–5.2)
BILIRUBIN TOTAL: 0.6 mg/dL (ref 0.2–1.2)
BUN: 16 mg/dL (ref 6–23)
CO2: 24 meq/L (ref 19–32)
Calcium: 9.2 mg/dL (ref 8.4–10.5)
Chloride: 107 mEq/L (ref 96–112)
Creat: 0.69 mg/dL (ref 0.50–1.10)
Glucose, Bld: 76 mg/dL (ref 70–99)
Potassium: 4.2 mEq/L (ref 3.5–5.3)
SODIUM: 136 meq/L (ref 135–145)
Total Protein: 6.7 g/dL (ref 6.0–8.3)

## 2014-12-14 LAB — CBC WITH DIFFERENTIAL/PLATELET
BASOS ABS: 0 10*3/uL (ref 0.0–0.1)
BASOS PCT: 0 % (ref 0–1)
Eosinophils Absolute: 0.1 10*3/uL (ref 0.0–0.7)
Eosinophils Relative: 2 % (ref 0–5)
HCT: 42.3 % (ref 36.0–46.0)
Hemoglobin: 14.4 g/dL (ref 12.0–15.0)
Lymphocytes Relative: 34 % (ref 12–46)
Lymphs Abs: 2 10*3/uL (ref 0.7–4.0)
MCH: 30.4 pg (ref 26.0–34.0)
MCHC: 34 g/dL (ref 30.0–36.0)
MCV: 89.4 fL (ref 78.0–100.0)
MPV: 10.7 fL (ref 8.6–12.4)
Monocytes Absolute: 0.4 10*3/uL (ref 0.1–1.0)
Monocytes Relative: 6 % (ref 3–12)
NEUTROS ABS: 3.5 10*3/uL (ref 1.7–7.7)
NEUTROS PCT: 58 % (ref 43–77)
Platelets: 203 10*3/uL (ref 150–400)
RBC: 4.73 MIL/uL (ref 3.87–5.11)
RDW: 13.1 % (ref 11.5–15.5)
WBC: 6 10*3/uL (ref 4.0–10.5)

## 2014-12-14 LAB — LIPID PANEL
CHOLESTEROL: 140 mg/dL (ref 0–200)
HDL: 52 mg/dL (ref >39)
LDL Cholesterol: 69 mg/dL (ref 0–99)
TRIGLYCERIDES: 96 mg/dL (ref <150)
Total CHOL/HDL Ratio: 2.7 Ratio
VLDL: 19 mg/dL (ref 0–40)

## 2014-12-15 ENCOUNTER — Encounter: Payer: Self-pay | Admitting: Internal Medicine

## 2014-12-15 ENCOUNTER — Ambulatory Visit (INDEPENDENT_AMBULATORY_CARE_PROVIDER_SITE_OTHER): Payer: BLUE CROSS/BLUE SHIELD | Admitting: Internal Medicine

## 2014-12-15 VITALS — BP 116/70 | HR 74 | Temp 98.4°F | Wt 141.5 lb

## 2014-12-15 DIAGNOSIS — F909 Attention-deficit hyperactivity disorder, unspecified type: Secondary | ICD-10-CM

## 2014-12-15 DIAGNOSIS — E559 Vitamin D deficiency, unspecified: Secondary | ICD-10-CM

## 2014-12-15 DIAGNOSIS — Z0000 Encounter for general adult medical examination without abnormal findings: Secondary | ICD-10-CM

## 2014-12-15 DIAGNOSIS — F988 Other specified behavioral and emotional disorders with onset usually occurring in childhood and adolescence: Secondary | ICD-10-CM

## 2014-12-15 DIAGNOSIS — R002 Palpitations: Secondary | ICD-10-CM

## 2014-12-15 LAB — POCT URINALYSIS DIPSTICK
BILIRUBIN UA: NEGATIVE
Glucose, UA: NEGATIVE
KETONES UA: NEGATIVE
LEUKOCYTES UA: NEGATIVE
Nitrite, UA: NEGATIVE
PH UA: 7
Protein, UA: NEGATIVE
RBC UA: NEGATIVE
Urobilinogen, UA: NEGATIVE

## 2014-12-15 LAB — TSH: TSH: 2.021 u[IU]/mL (ref 0.350–4.500)

## 2014-12-15 LAB — VITAMIN D 25 HYDROXY (VIT D DEFICIENCY, FRACTURES): VIT D 25 HYDROXY: 25 ng/mL — AB (ref 30–100)
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
## 2014-12-15 NOTE — Patient Instructions (Addendum)
Watch caffeine consumption. If palpitations persist, Lopressor can be prescribed. Please see Dr. Johnnye Sima regarding attention deficit issues. Take 2000 units vitamin D 3 daily.

## 2015-01-02 ENCOUNTER — Encounter: Payer: Self-pay | Admitting: Internal Medicine

## 2015-07-02 ENCOUNTER — Other Ambulatory Visit: Payer: Self-pay

## 2015-07-02 DIAGNOSIS — Z1231 Encounter for screening mammogram for malignant neoplasm of breast: Secondary | ICD-10-CM

## 2015-08-11 ENCOUNTER — Ambulatory Visit: Payer: Self-pay

## 2016-08-03 ENCOUNTER — Other Ambulatory Visit: Payer: Self-pay | Admitting: Internal Medicine

## 2016-08-03 ENCOUNTER — Other Ambulatory Visit: Payer: BLUE CROSS/BLUE SHIELD | Admitting: Internal Medicine

## 2016-08-03 DIAGNOSIS — Z0000 Encounter for general adult medical examination without abnormal findings: Secondary | ICD-10-CM

## 2016-08-03 DIAGNOSIS — I341 Nonrheumatic mitral (valve) prolapse: Secondary | ICD-10-CM

## 2016-08-03 DIAGNOSIS — F419 Anxiety disorder, unspecified: Secondary | ICD-10-CM

## 2016-08-03 LAB — COMPLETE METABOLIC PANEL WITH GFR
ALT: 13 U/L (ref 6–29)
AST: 21 U/L (ref 10–35)
Albumin: 4.6 g/dL (ref 3.6–5.1)
Alkaline Phosphatase: 65 U/L (ref 33–115)
BUN: 16 mg/dL (ref 7–25)
CALCIUM: 9.7 mg/dL (ref 8.6–10.2)
CHLORIDE: 103 mmol/L (ref 98–110)
CO2: 24 mmol/L (ref 20–31)
Creat: 0.79 mg/dL (ref 0.50–1.10)
GFR, Est Non African American: 89 mL/min (ref >=60)
Glucose, Bld: 76 mg/dL (ref 65–99)
POTASSIUM: 4.8 mmol/L (ref 3.5–5.3)
Sodium: 139 mmol/L (ref 135–146)
Total Bilirubin: 0.5 mg/dL (ref 0.2–1.2)
Total Protein: 7.2 g/dL (ref 6.1–8.1)

## 2016-08-03 LAB — LIPID PANEL
CHOL/HDL RATIO: 2.4 ratio (ref <=5.0)
CHOLESTEROL: 163 mg/dL (ref 125–200)
HDL: 68 mg/dL (ref >=46)
LDL Cholesterol: 79 mg/dL (ref <130)
TRIGLYCERIDES: 78 mg/dL (ref <150)
VLDL: 16 mg/dL (ref <30)

## 2016-08-03 LAB — CBC WITH DIFFERENTIAL/PLATELET
BASOS ABS: 57 {cells}/uL (ref 0–200)
Basophils Relative: 1 %
Eosinophils Absolute: 114 cells/uL (ref 15–500)
Eosinophils Relative: 2 %
HEMATOCRIT: 44.5 % (ref 35.0–45.0)
Hemoglobin: 14.7 g/dL (ref 11.7–15.5)
LYMPHS ABS: 2223 {cells}/uL (ref 850–3900)
Lymphocytes Relative: 39 %
MCH: 30.4 pg (ref 27.0–33.0)
MCHC: 33 g/dL (ref 32.0–36.0)
MCV: 91.9 fL (ref 80.0–100.0)
MONOS PCT: 5 %
MPV: 11.1 fL (ref 7.5–12.5)
Monocytes Absolute: 285 cells/uL (ref 200–950)
NEUTROS ABS: 3021 {cells}/uL (ref 1500–7800)
Neutrophils Relative %: 53 %
Platelets: 221 10*3/uL (ref 140–400)
RBC: 4.84 MIL/uL (ref 3.80–5.10)
RDW: 13.5 % (ref 11.0–15.0)
WBC: 5.7 10*3/uL (ref 3.8–10.8)

## 2016-08-03 LAB — TSH: TSH: 1.58 mIU/L

## 2016-08-04 LAB — VITAMIN D 25 HYDROXY (VIT D DEFICIENCY, FRACTURES): Vit D, 25-Hydroxy: 39 ng/mL (ref 30–100)

## 2016-08-08 ENCOUNTER — Encounter: Payer: Self-pay | Admitting: Internal Medicine

## 2016-08-08 ENCOUNTER — Telehealth: Payer: Self-pay

## 2016-08-08 ENCOUNTER — Ambulatory Visit (INDEPENDENT_AMBULATORY_CARE_PROVIDER_SITE_OTHER): Payer: BLUE CROSS/BLUE SHIELD | Admitting: Internal Medicine

## 2016-08-08 VITALS — BP 118/74 | HR 78 | Temp 97.9°F | Wt 134.5 lb

## 2016-08-08 DIAGNOSIS — Z0000 Encounter for general adult medical examination without abnormal findings: Secondary | ICD-10-CM | POA: Diagnosis not present

## 2016-08-08 DIAGNOSIS — N912 Amenorrhea, unspecified: Secondary | ICD-10-CM | POA: Diagnosis not present

## 2016-08-08 LAB — POCT URINALYSIS DIPSTICK
BILIRUBIN UA: NEGATIVE
Blood, UA: NEGATIVE
GLUCOSE UA: NEGATIVE
Ketones, UA: NEGATIVE
LEUKOCYTES UA: NEGATIVE
NITRITE UA: NEGATIVE
Protein, UA: NEGATIVE
Spec Grav, UA: <=1.005
UROBILINOGEN UA: 0.2
pH, UA: 6

## 2016-08-08 NOTE — Telephone Encounter (Signed)
Called [facility] and added FSH to blood draw on 08/31.

## 2016-08-08 NOTE — Patient Instructions (Addendum)
Add FSH. To see GYN re amenorrhea. To receive flu vaccine through employment. It was a pleasure to see you today.

## 2016-08-09 LAB — FOLLICLE STIMULATING HORMONE: FSH: 74.3 m[IU]/mL

## 2016-08-10 ENCOUNTER — Telehealth: Payer: Self-pay

## 2016-08-10 NOTE — Telephone Encounter (Signed)
Called patient to give lab results. No answer. Will try later.  

## 2016-08-10 NOTE — Telephone Encounter (Signed)
-----   Message from Elby Showers, MD sent at 08/09/2016 10:30 AM EDT ----- [employer] is consistent with menopause

## 2016-08-10 NOTE — Telephone Encounter (Signed)
Called patient. Gave lab results. Patient verbalized understanding.  

## 2016-08-18 ENCOUNTER — Ambulatory Visit (HOSPITAL_COMMUNITY)
Admission: EM | Admit: 2016-08-18 | Discharge: 2016-08-18 | Disposition: A | Discharge status: Home / Self Care | Payer: BLUE CROSS/BLUE SHIELD | Attending: Family Medicine | Admitting: Family Medicine

## 2016-08-18 ENCOUNTER — Encounter (HOSPITAL_COMMUNITY): Payer: Self-pay | Admitting: *Deleted

## 2016-08-18 DIAGNOSIS — R109 Unspecified abdominal pain: Secondary | ICD-10-CM | POA: Insufficient documentation

## 2016-08-18 DIAGNOSIS — R300 Dysuria: Secondary | ICD-10-CM | POA: Diagnosis not present

## 2016-08-18 DIAGNOSIS — N390 Urinary tract infection, site not specified: Secondary | ICD-10-CM | POA: Diagnosis present

## 2016-08-18 LAB — POCT URINALYSIS DIP (DEVICE)
Bilirubin Urine: NEGATIVE
Glucose, UA: NEGATIVE mg/dL
Hgb urine dipstick: NEGATIVE
KETONES UR: NEGATIVE mg/dL
Nitrite: NEGATIVE
PH: 7.5 (ref 5.0–8.0)
PROTEIN: NEGATIVE mg/dL
SPECIFIC GRAVITY, URINE: 1.015 (ref 1.005–1.030)
UROBILINOGEN UA: 0.2 mg/dL (ref 0.0–1.0)

## 2016-08-18 MED ORDER — CIPROFLOXACIN HCL 500 MG PO TABS
500.0000 mg | ORAL_TABLET | Freq: Two times a day (BID) | ORAL | 0 refills | Status: AC
Start: 2016-08-18 — End: 2016-08-21

## 2016-08-18 NOTE — Discharge Instructions (Signed)
Start Cipro twice a day as directed. Increase fluid intake. Follow-up with your primary care provider if symptoms do not improve within 3 days or pending urine culture results.

## 2016-08-18 NOTE — ED Triage Notes (Signed)
Pt  Reports     Symptoms   Of   Symptoms      Of  Urinary frequency   And     Discomfort       Symptoms  Of  Burning  And  Frequency

## 2016-08-18 NOTE — ED Provider Notes (Signed)
CSN: [id number]     Arrival date & time 08/18/16  1007 History   First MD Initiated Contact with Patient 08/18/16 1110     Chief Complaint  Patient presents with  . Recurrent UTI   (Consider location/radiation/quality/duration/timing/severity/associated sxs/prior Treatment) 45 year old female presents with urinary frequency, urgency and burning for the past week. Symptoms have gotten worse over the past 2 days. She denies any hematuria, nausea, vomiting or vaginal discharge. She is experiencing some right sided flank pain. She has a history of recurrent UTI's but it has been over 3 years since last infection.    The history is provided by the patient.    Past Medical History:  Diagnosis Date  . Bilateral carpal tunnel syndrome   . Migraine   . Mitral valve prolapse    Past Surgical History:  Procedure Laterality Date  . LAPAROSCOPIC ENDOMETRIOSIS FULGURATION  2000   Family History  Problem Relation Age of Onset  . Asthma Mother   . Cerebral palsy Daughter    Social History  Substance Use Topics  . Smoking status: Never Smoker  . Smokeless tobacco: Never Used  . Alcohol use No   OB History    No data available     Review of Systems  Constitutional: Negative for chills, fatigue and fever.  Gastrointestinal: Positive for abdominal pain. Negative for diarrhea, nausea and vomiting.  Genitourinary: Positive for dysuria, flank pain, frequency and urgency. Negative for hematuria, pelvic pain and vaginal discharge.  Neurological: Negative for headaches.    Allergies  Sulfa antibiotics  Home Medications   Prior to Admission medications   Medication Sig Start Date End Date Taking? Authorizing Provider  ciprofloxacin (CIPRO) 500 MG tablet Take 1 tablet (500 mg total) by mouth 2 (two) times daily. 08/18/16 08/21/16  Katy Apo, NP   Meds Ordered and Administered this Visit  Medications - No data to display  BP 103/77 (BP Location: Left Arm)   Pulse 71   Temp 98 F  (36.7 C) (Oral)   Resp 12   LMP 08/04/2016   SpO2 99%  No data found.   Physical Exam  Constitutional: She is oriented to person, place, and time. She appears well-developed and well-nourished. No distress.  Cardiovascular: Normal rate, regular rhythm and normal heart sounds.   Pulmonary/Chest: Effort normal and breath sounds normal.  Abdominal: Soft. Normal appearance and bowel sounds are normal. There is tenderness in the suprapubic area. There is CVA tenderness (right side only). There is no rigidity, no rebound and no guarding.  Neurological: She is alert and oriented to person, place, and time.  Skin: Skin is warm and dry. Capillary refill takes less than 2 seconds.  Psychiatric: She has a normal mood and affect. Her behavior is normal. Judgment and thought content normal.    Urgent Care Course   Clinical Course    Procedures (including critical care time)  Labs Review Labs Reviewed  POCT URINALYSIS DIP (DEVICE) - Abnormal; Notable for the following:       Result Value   Leukocytes, UA TRACE (*)    All other components within normal limits  URINE CULTURE    Imaging Review No results found.   Visual Acuity Review  Right Eye Distance:   Left Eye Distance:   Bilateral Distance:    Right Eye Near:   Left Eye Near:    Bilateral Near:         MDM   1. Dysuria   2. Right flank  pain    Reviewed urinalysis results with patient. Trace of WBC's but otherwise normal. Discussed various antibiotics- will try Cipro 500mg  twice a day for 3 days, especially since experiencing right sided flank pain. Urine sent for culture. Increase fluid intake and make take OTC AZO as needed for pain. Follow-up pending urine culture results or with her primary care provider if symptoms do not resolve within 3 days.     Katy Apo, NP 08/18/16 1148

## 2016-08-19 LAB — URINE CULTURE: Special Requests: NORMAL

## 2016-08-23 ENCOUNTER — Telehealth (HOSPITAL_COMMUNITY): Payer: Self-pay | Admitting: Emergency Medicine

## 2016-08-23 NOTE — Telephone Encounter (Signed)
Called pt and notified of recent lab results Pt ID'd properly... Reports feeling better and sx have subsided States she finished her antibiotics and tolerated well.  Adv pt if sx are not getting better to return or to f/u w/PCP Pt verb understanding.

## 2016-08-23 NOTE — Telephone Encounter (Signed)
-----   Message from Sherlene Shams, MD sent at 08/19/2016  9:43 PM EDT ----- Clinical staff, please let patient know that urine culture does not demonstrate a UTI.   Recheck or followup with PCP Dr Renold Genta for further evaluation if flank pain/urinary discomfort persists.  LM

## 2016-08-30 ENCOUNTER — Encounter: Payer: Self-pay | Admitting: Internal Medicine
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
## 2016-10-10 DIAGNOSIS — F411 Generalized anxiety disorder: Secondary | ICD-10-CM | POA: Diagnosis not present

## 2016-11-13 DIAGNOSIS — D1801 Hemangioma of skin and subcutaneous tissue: Secondary | ICD-10-CM | POA: Diagnosis not present

## 2016-11-13 DIAGNOSIS — C44311 Basal cell carcinoma of skin of nose: Secondary | ICD-10-CM | POA: Diagnosis not present

## 2016-11-13 DIAGNOSIS — Z85828 Personal history of other malignant neoplasm of skin: Secondary | ICD-10-CM | POA: Diagnosis not present

## 2016-11-13 DIAGNOSIS — D2261 Melanocytic nevi of right upper limb, including shoulder: Secondary | ICD-10-CM | POA: Diagnosis not present

## 2016-11-13 DIAGNOSIS — D225 Melanocytic nevi of trunk: Secondary | ICD-10-CM | POA: Diagnosis not present

## 2016-12-04 DIAGNOSIS — C801 Malignant (primary) neoplasm, unspecified: Secondary | ICD-10-CM

## 2016-12-04 HISTORY — DX: Malignant (primary) neoplasm, unspecified: C80.1

## 2016-12-06 DIAGNOSIS — C44311 Basal cell carcinoma of skin of nose: Secondary | ICD-10-CM | POA: Diagnosis not present

## 2016-12-13 ENCOUNTER — Encounter: Payer: Self-pay | Admitting: Internal Medicine

## 2016-12-15 DIAGNOSIS — F411 Generalized anxiety disorder: Secondary | ICD-10-CM | POA: Diagnosis not present

## 2016-12-27 ENCOUNTER — Ambulatory Visit (INDEPENDENT_AMBULATORY_CARE_PROVIDER_SITE_OTHER): Payer: BLUE CROSS/BLUE SHIELD | Admitting: Obstetrics and Gynecology

## 2016-12-27 ENCOUNTER — Encounter: Payer: Self-pay | Admitting: Obstetrics and Gynecology

## 2016-12-27 VITALS — BP 104/68 | HR 64 | Resp 14 | Ht 65.5 in | Wt 135.6 lb

## 2016-12-27 DIAGNOSIS — Z01419 Encounter for gynecological examination (general) (routine) without abnormal findings: Secondary | ICD-10-CM

## 2016-12-27 NOTE — Patient Instructions (Signed)

## 2016-12-27 NOTE — Progress Notes (Signed)
46 y.o. G11P2002 Married Caucasian female here for annual exam.    Irregular menses, skipping, but they are normal in character. FSH was 74.3 on 08/03/16.  Had 3 - 4 menses in the last year.  Having hot flashes that can wake her up at night.  Declines HRT.  Is a Social worker at [employer].  2 daughters 28 and 63 - they like the arts.  Husband works for [employer].  PCP: Emeline General, MD  Patient's last menstrual period was 10/18/2016 (approximate).     Period Pattern: (!) Irregular     Sexually active: Yes.  female  The current method of family planning is vasectomy.    Exercising: Yes.    Aerobics, spin, weights Smoker:  no  Health Maintenance: Pap:  07/2015 normal.   History of abnormal Pap:  Yes, patient thinks she had an abnormal pap 2003 with colposcopy which was negative--no treatment to cervix. MMG:  2016 normal with [facility].   Colonoscopy:  n/a BMD:   n/a  Result  n/a TDaP:  PCP Gardasil:   N/A   Screening Labs:  Hb today: PCP, Urine today:  Declines testing.    reports that she has never smoked. She has never used smokeless tobacco. She reports that she drinks alcohol. She reports that she does not use drugs.  Past Medical History:  Diagnosis Date  . Abnormal Pap smear of cervix 2003   neg colposcopy--no treatment to cervix  . Anemia   . Anxiety   . Bilateral carpal tunnel syndrome   . Blood transfusion without reported diagnosis 2003   Hx placenta accreta  . Cancer ([place]) 12/2016   Basal cell of nose  . Endometriosis   . Hypoglycemia   . Infertility, female   . Migraine   . Mitral valve prolapse   . Urinary incontinence     Past Surgical History:  Procedure Laterality Date  . LAPAROSCOPIC ENDOMETRIOSIS FULGURATION  2000  . PELVIC LAPAROSCOPY      Current Outpatient Prescriptions  Medication Sig Dispense Refill  . MELATONIN PO Take 1 tablet by mouth at bedtime.    . Multiple Vitamin (MULTIVITAMIN) capsule Take 1 capsule by mouth daily.     . Omega-3 Fatty Acids (FISH OIL) 1000 MG CAPS Take 1 capsule by mouth daily.     No current facility-administered medications for this visit.     Family History  Problem Relation Age of Onset  . Asthma Mother   . Cancer Father 59    prostate cancer  . Hyperlipidemia Father   . Aneurysm Sister     with brain aneurysm--doing well  . Cerebral palsy Daughter   . Breast cancer Maternal Aunt 68    A & W--BRCA neg  . Diabetes Maternal Aunt   . Stroke Paternal Grandmother   . Aneurysm Paternal Aunt     dec brain aneurysm    ROS:  Pertinent items are noted in HPI.  Otherwise, a comprehensive ROS was negative.  Exam:   BP 104/68 (BP Location: Right Arm, Patient Position: Sitting, Cuff Size: Normal)   Pulse 64   Resp 14   Ht 5' 5.5" (1.664 m)   Wt 135 lb 9.6 oz (61.5 kg)   LMP 10/18/2016 (Approximate)   BMI 22.22 kg/m     General appearance: alert, cooperative and appears stated age Head: Normocephalic, without obvious abnormality, atraumatic Neck: no adenopathy, supple, symmetrical, trachea midline and thyroid normal to inspection and palpation Lungs: clear to auscultation bilaterally  Breasts: normal appearance, no masses or tenderness, No nipple retraction or dimpling, No nipple discharge or bleeding, No axillary or supraclavicular adenopathy Heart: regular rate and rhythm Abdomen: soft, non-tender; no masses, no organomegaly Extremities: extremities normal, atraumatic, no cyanosis or edema Skin: Skin color, texture, turgor normal. No rashes or lesions Lymph nodes: Cervical, supraclavicular, and axillary nodes normal. No abnormal inguinal nodes palpated Neurologic: Grossly normal  Pelvic: External genitalia:  no lesions              Urethra:  normal appearing urethra with no masses, tenderness or lesions              Bartholins and Skenes: normal                 Vagina: normal appearing vagina with normal color and discharge, no lesions              Cervix: no lesions               Pap taken: Yes.   Bimanual Exam:  Uterus:  normal size, contour, position, consistency, mobility, non-tender              Adnexa: no mass, fullness, tenderness              Rectal exam: Yes.  .  Confirms.              Anus:  normal sphincter tone, no lesions  Chaperone was present for exam.  Assessment:   Well woman visit with normal exam. Remote hx abnormal pap. Hx endometriosis.  Perimenopausal female.  Plan: Mammogram screening discussed. I gave [facility]. Recommended self breast awareness. Pap and HR HPV as above. Guidelines for Calcium, Vitamin D, regular exercise program including cardiovascular and weight bearing exercise. We dicussed menopause and I provided reading materials to her. Follow up annually and prn.     After visit summary provided.

## 2016-12-28 LAB — IPS PAP TEST WITH HPV

## 2017-01-05 DIAGNOSIS — F411 Generalized anxiety disorder: Secondary | ICD-10-CM | POA: Diagnosis not present

## 2017-01-19 DIAGNOSIS — F411 Generalized anxiety disorder: Secondary | ICD-10-CM | POA: Diagnosis not present

## 2017-03-02 DIAGNOSIS — F411 Generalized anxiety disorder: Secondary | ICD-10-CM | POA: Diagnosis not present

## 2017-03-27 ENCOUNTER — Other Ambulatory Visit: Payer: Self-pay | Admitting: Internal Medicine

## 2017-03-27 DIAGNOSIS — Z1231 Encounter for screening mammogram for malignant neoplasm of breast: Secondary | ICD-10-CM

## 2017-04-13 DIAGNOSIS — F411 Generalized anxiety disorder: Secondary | ICD-10-CM | POA: Diagnosis not present

## 2017-04-20 ENCOUNTER — Ambulatory Visit: Payer: BLUE CROSS/BLUE SHIELD

## 2017-05-18 DIAGNOSIS — F419 Anxiety disorder, unspecified: Secondary | ICD-10-CM | POA: Diagnosis not present

## 2017-05-18 DIAGNOSIS — F900 Attention-deficit hyperactivity disorder, predominantly inattentive type: Secondary | ICD-10-CM | POA: Diagnosis not present

## 2017-05-23 DIAGNOSIS — L814 Other melanin hyperpigmentation: Secondary | ICD-10-CM | POA: Diagnosis not present

## 2017-05-23 DIAGNOSIS — D1801 Hemangioma of skin and subcutaneous tissue: Secondary | ICD-10-CM | POA: Diagnosis not present

## 2017-05-23 DIAGNOSIS — L821 Other seborrheic keratosis: Secondary | ICD-10-CM | POA: Diagnosis not present

## 2017-05-23 DIAGNOSIS — Z85828 Personal history of other malignant neoplasm of skin: Secondary | ICD-10-CM | POA: Diagnosis not present

## 2017-05-23 DIAGNOSIS — L570 Actinic keratosis: Secondary | ICD-10-CM | POA: Diagnosis not present

## 2017-06-07 ENCOUNTER — Ambulatory Visit
Admission: RE | Admit: 2017-06-07 | Discharge: 2017-06-07 | Disposition: A | Discharge status: Home / Self Care | Payer: BLUE CROSS/BLUE SHIELD | Source: Ambulatory Visit | Attending: Internal Medicine | Admitting: Internal Medicine

## 2017-06-07 DIAGNOSIS — Z1231 Encounter for screening mammogram for malignant neoplasm of breast: Secondary | ICD-10-CM

## 2017-07-24 DIAGNOSIS — F411 Generalized anxiety disorder: Secondary | ICD-10-CM | POA: Diagnosis not present

## 2017-07-25 DIAGNOSIS — F900 Attention-deficit hyperactivity disorder, predominantly inattentive type: Secondary | ICD-10-CM | POA: Diagnosis not present

## 2017-07-25 DIAGNOSIS — F419 Anxiety disorder, unspecified: Secondary | ICD-10-CM | POA: Diagnosis not present

## 2017-09-25 DIAGNOSIS — D1801 Hemangioma of skin and subcutaneous tissue: Secondary | ICD-10-CM | POA: Diagnosis not present

## 2017-09-25 DIAGNOSIS — Z85828 Personal history of other malignant neoplasm of skin: Secondary | ICD-10-CM | POA: Diagnosis not present

## 2017-09-25 DIAGNOSIS — D2261 Melanocytic nevi of right upper limb, including shoulder: Secondary | ICD-10-CM | POA: Diagnosis not present

## 2017-09-25 DIAGNOSIS — L814 Other melanin hyperpigmentation: Secondary | ICD-10-CM | POA: Diagnosis not present

## 2017-09-27 DIAGNOSIS — F419 Anxiety disorder, unspecified: Secondary | ICD-10-CM | POA: Diagnosis not present

## 2017-09-27 DIAGNOSIS — F900 Attention-deficit hyperactivity disorder, predominantly inattentive type: Secondary | ICD-10-CM | POA: Diagnosis not present

## 2017-12-16 IMAGING — MG 2D DIGITAL SCREENING BILATERAL MAMMOGRAM WITH CAD AND ADJUNCT TO
9 of 14 series · 9 of 30 positions shown · non-contrast
Comparison: Previous exam(s).

CLINICAL DATA: Screening.

EXAM:
2D DIGITAL SCREENING BILATERAL MAMMOGRAM WITH CAD AND ADJUNCT TOMO

[L CC (1 of 2)]
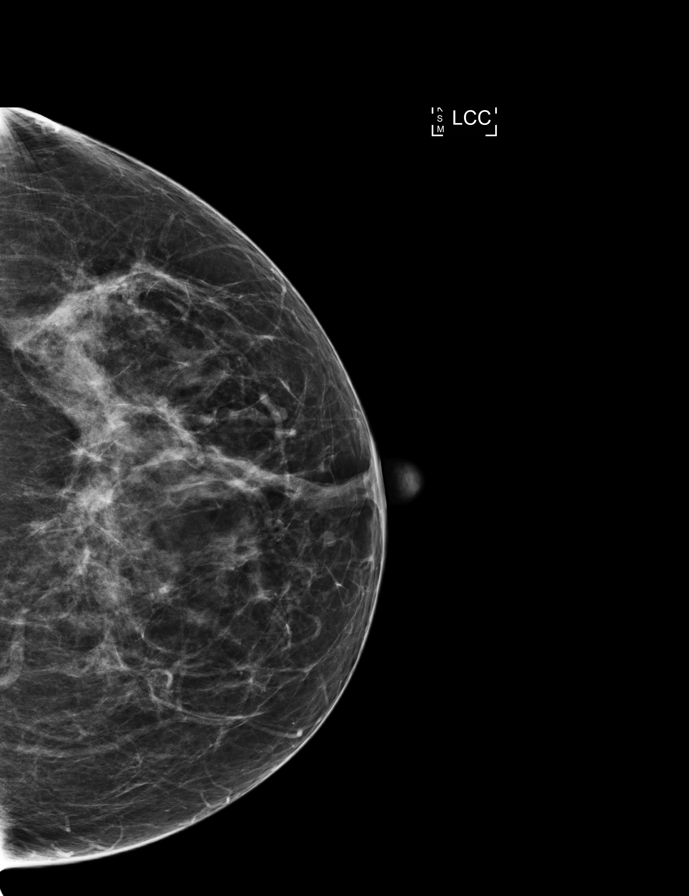

[R MLO (1 of 2)]
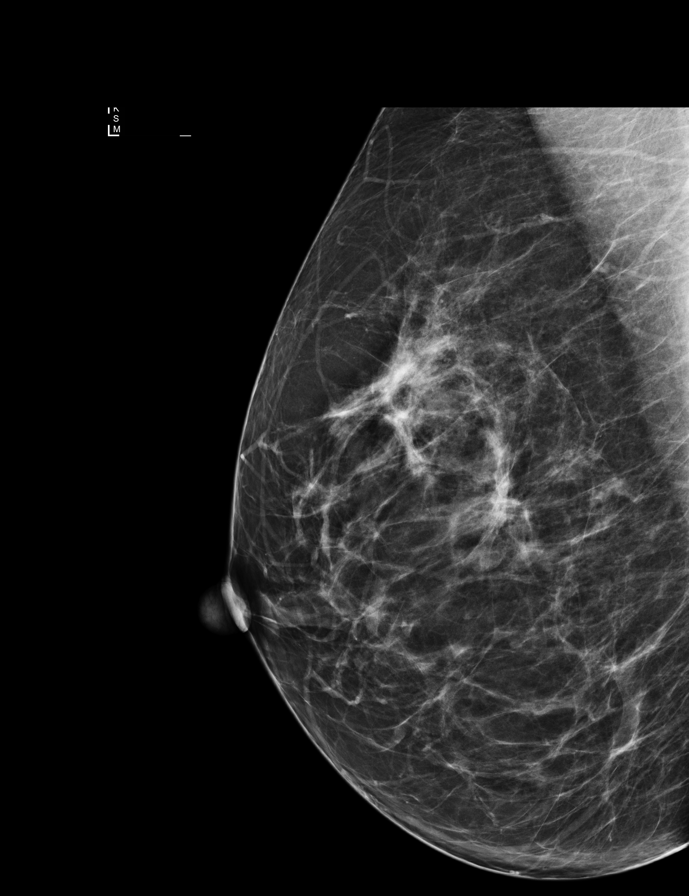

[R CC synth-2D]
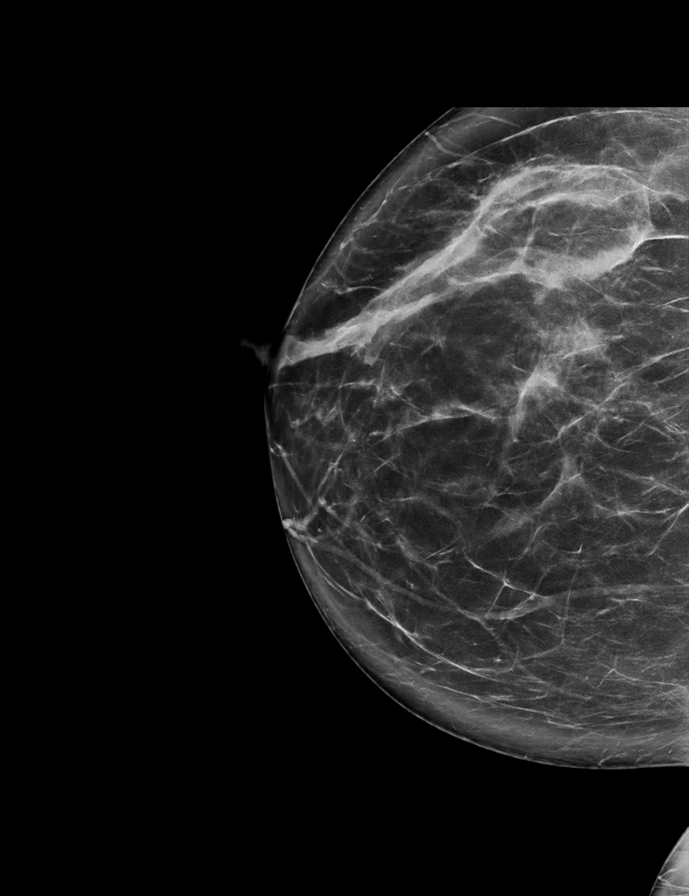

[L CC synth-2D]
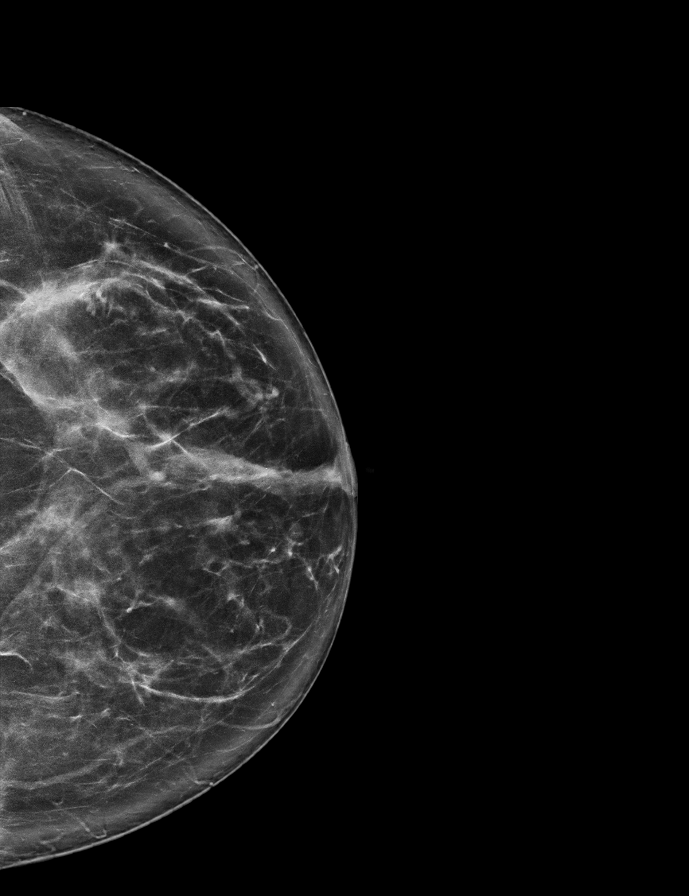

[L CC (2 of 2)]
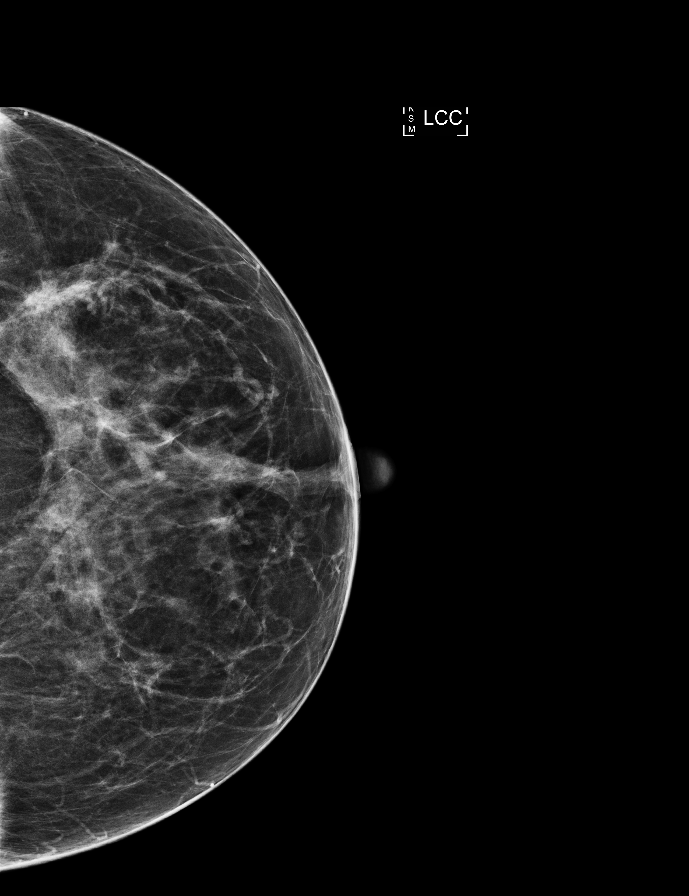

[R CC]
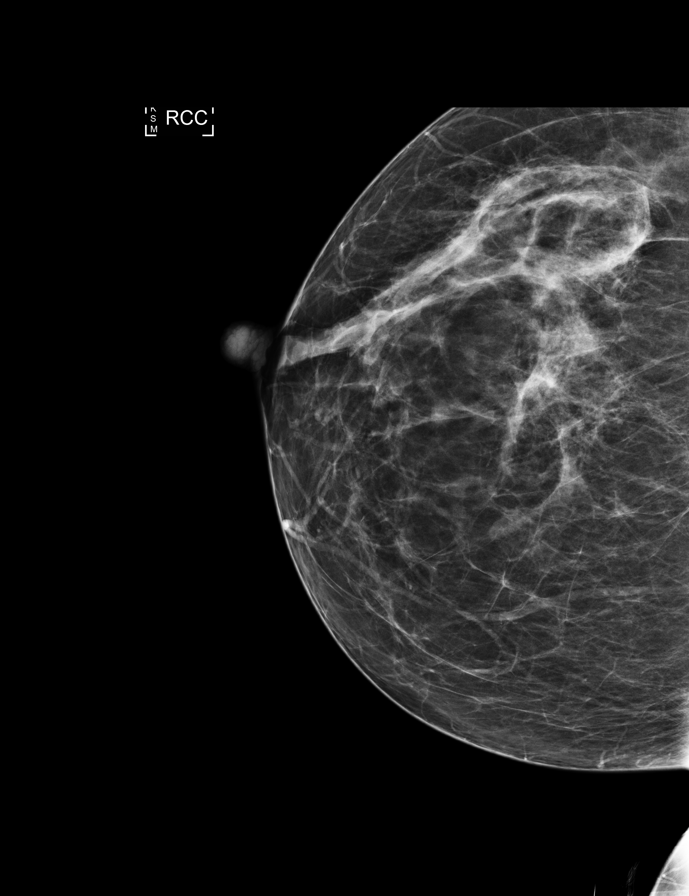

[R MLO (2 of 2)]
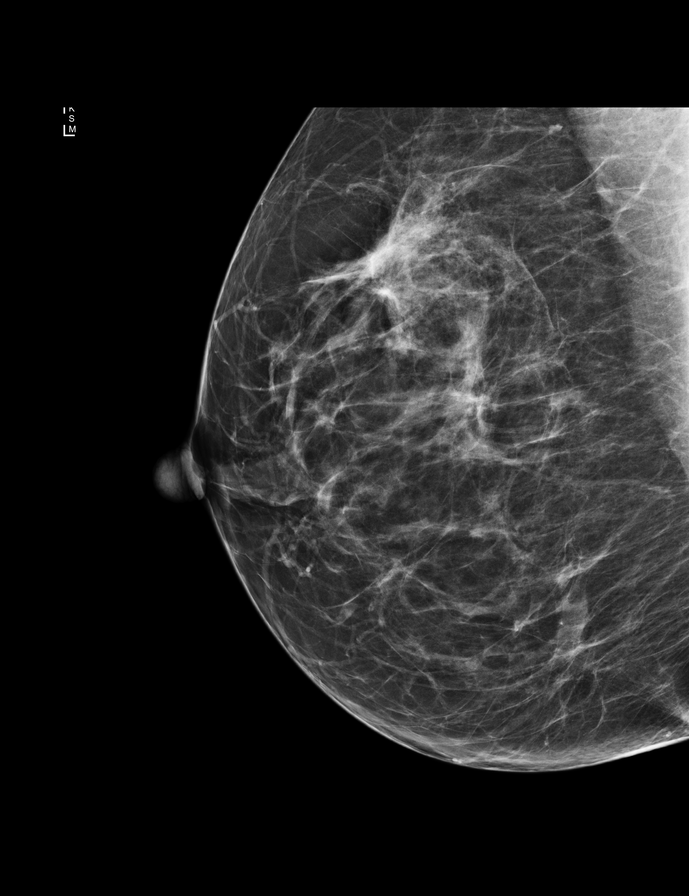

[L MLO]
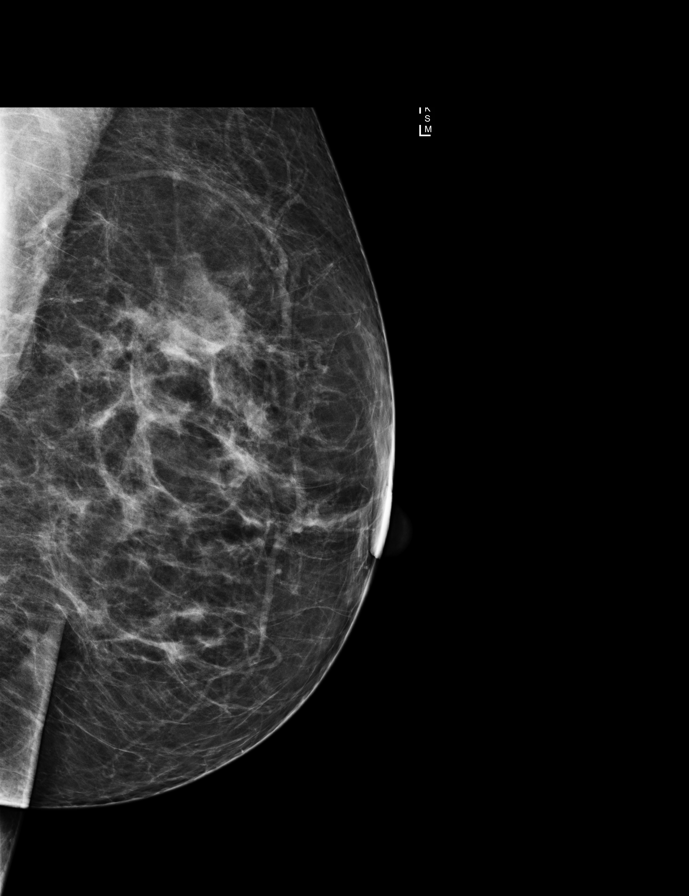

[L MLO synth-2D]
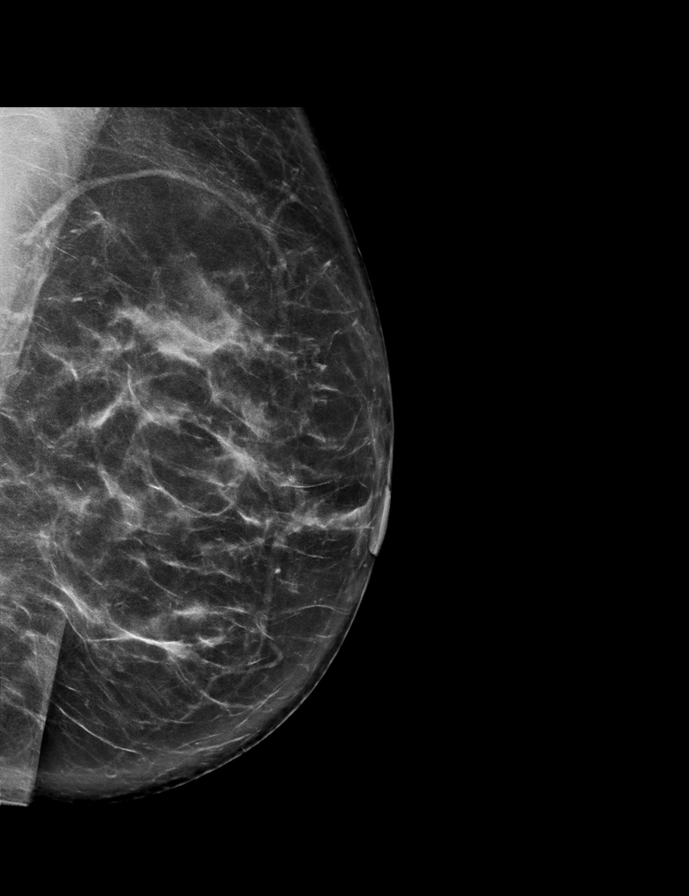

[9 of 30 positions shown; findings below may reference images not displayed]

ACR Breast Density Category c: The breast tissue is heterogeneously
dense, which may obscure small masses.
FINDINGS: There are no findings suspicious for malignancy. Images were
processed with CAD.
IMPRESSION: No mammographic evidence of malignancy. A result letter of this
screening mammogram will be mailed directly to the patient.

RECOMMENDATION:
Screening mammogram in one year. (Code:TN-[id number])

BI-RADS CATEGORY  1: Negative.

## 2018-01-08 DIAGNOSIS — F900 Attention-deficit hyperactivity disorder, predominantly inattentive type: Secondary | ICD-10-CM | POA: Diagnosis not present

## 2018-01-08 DIAGNOSIS — F419 Anxiety disorder, unspecified: Secondary | ICD-10-CM | POA: Diagnosis not present

## 2018-02-28 ENCOUNTER — Other Ambulatory Visit: Payer: Self-pay | Admitting: Internal Medicine

## 2018-02-28 DIAGNOSIS — Z13220 Encounter for screening for lipoid disorders: Secondary | ICD-10-CM

## 2018-02-28 DIAGNOSIS — Z0000 Encounter for general adult medical examination without abnormal findings: Secondary | ICD-10-CM

## 2018-02-28 DIAGNOSIS — Z8639 Personal history of other endocrine, nutritional and metabolic disease: Secondary | ICD-10-CM

## 2018-02-28 DIAGNOSIS — Z1321 Encounter for screening for nutritional disorder: Secondary | ICD-10-CM

## 2018-02-28 DIAGNOSIS — Z1329 Encounter for screening for other suspected endocrine disorder: Secondary | ICD-10-CM

## 2018-03-14 ENCOUNTER — Other Ambulatory Visit: Payer: BLUE CROSS/BLUE SHIELD | Admitting: Internal Medicine

## 2018-03-14 DIAGNOSIS — E559 Vitamin D deficiency, unspecified: Secondary | ICD-10-CM | POA: Diagnosis not present

## 2018-03-14 DIAGNOSIS — Z0000 Encounter for general adult medical examination without abnormal findings: Secondary | ICD-10-CM

## 2018-03-14 DIAGNOSIS — Z8639 Personal history of other endocrine, nutritional and metabolic disease: Secondary | ICD-10-CM

## 2018-03-14 DIAGNOSIS — Z13220 Encounter for screening for lipoid disorders: Secondary | ICD-10-CM

## 2018-03-14 DIAGNOSIS — Z1321 Encounter for screening for nutritional disorder: Secondary | ICD-10-CM

## 2018-03-14 DIAGNOSIS — Z1329 Encounter for screening for other suspected endocrine disorder: Secondary | ICD-10-CM

## 2018-03-15 LAB — COMPLETE METABOLIC PANEL WITH GFR
AG Ratio: 2.5 (calc) (ref 1.0–2.5)
ALBUMIN MSPROF: 4.7 g/dL (ref 3.6–5.1)
ALKALINE PHOSPHATASE (APISO): 68 U/L (ref 33–115)
ALT: 37 U/L — ABNORMAL HIGH (ref 6–29)
AST: 30 U/L (ref 10–35)
BUN: 13 mg/dL (ref 7–25)
CALCIUM: 9.9 mg/dL (ref 8.6–10.2)
CO2: 29 mmol/L (ref 20–32)
CREATININE: 0.75 mg/dL (ref 0.50–1.10)
Chloride: 107 mmol/L (ref 98–110)
GFR, EST NON AFRICAN AMERICAN: 94 mL/min/{1.73_m2} (ref >=60)
GFR, Est African American: 109 mL/min/{1.73_m2} (ref >=60)
GLOBULIN: 1.9 g/dL (ref 1.9–3.7)
Glucose, Bld: 92 mg/dL (ref 65–99)
Potassium: 5.2 mmol/L (ref 3.5–5.3)
SODIUM: 142 mmol/L (ref 135–146)
Total Bilirubin: 0.5 mg/dL (ref 0.2–1.2)
Total Protein: 6.6 g/dL (ref 6.1–8.1)

## 2018-03-15 LAB — CBC WITH DIFFERENTIAL/PLATELET
BASOS ABS: 28 {cells}/uL (ref 0–200)
Basophils Relative: 0.5 %
EOS ABS: 112 {cells}/uL (ref 15–500)
Eosinophils Relative: 2 %
HCT: 41.2 % (ref 35.0–45.0)
Hemoglobin: 14 g/dL (ref 11.7–15.5)
Lymphs Abs: 2100 cells/uL (ref 850–3900)
MCH: 30.9 pg (ref 27.0–33.0)
MCHC: 34 g/dL (ref 32.0–36.0)
MCV: 90.9 fL (ref 80.0–100.0)
MPV: 11.9 fL (ref 7.5–12.5)
Monocytes Relative: 6.1 %
NEUTROS PCT: 53.9 %
Neutro Abs: 3018 cells/uL (ref 1500–7800)
PLATELETS: 172 10*3/uL (ref 140–400)
RBC: 4.53 10*6/uL (ref 3.80–5.10)
RDW: 12 % (ref 11.0–15.0)
TOTAL LYMPHOCYTE: 37.5 %
WBC: 5.6 10*3/uL (ref 3.8–10.8)
WBCMIX: 342 {cells}/uL (ref 200–950)

## 2018-03-15 LAB — LIPID PANEL
CHOLESTEROL: 176 mg/dL (ref <200)
HDL: 67 mg/dL (ref >50)
LDL CHOLESTEROL (CALC): 93 mg/dL
Non-HDL Cholesterol (Calc): 109 mg/dL (calc) (ref <130)
Total CHOL/HDL Ratio: 2.6 (calc) (ref <5.0)
Triglycerides: 70 mg/dL (ref <150)

## 2018-03-15 LAB — TSH: TSH: 1.61 m[IU]/L

## 2018-03-15 LAB — VITAMIN D 25 HYDROXY (VIT D DEFICIENCY, FRACTURES): Vit D, 25-Hydroxy: 36 ng/mL (ref 30–100)

## 2018-03-19 ENCOUNTER — Ambulatory Visit (INDEPENDENT_AMBULATORY_CARE_PROVIDER_SITE_OTHER): Payer: BLUE CROSS/BLUE SHIELD | Admitting: Internal Medicine

## 2018-03-19 ENCOUNTER — Encounter: Payer: Self-pay | Admitting: Internal Medicine

## 2018-03-19 VITALS — BP 110/70 | HR 64 | Ht 66.0 in | Wt 135.0 lb

## 2018-03-19 DIAGNOSIS — Z0000 Encounter for general adult medical examination without abnormal findings: Secondary | ICD-10-CM

## 2018-03-19 LAB — POCT URINALYSIS DIPSTICK
APPEARANCE: NORMAL
BILIRUBIN UA: NEGATIVE
Blood, UA: NEGATIVE
GLUCOSE UA: NEGATIVE
Ketones, UA: NEGATIVE
Leukocytes, UA: NEGATIVE
Nitrite, UA: NEGATIVE
ODOR: NORMAL
Protein, UA: NEGATIVE
Spec Grav, UA: 1.01 (ref 1.010–1.025)
Urobilinogen, UA: 0.2 E.U./dL
pH, UA: 7.5 (ref 5.0–8.0)
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
## 2018-03-31 NOTE — Patient Instructions (Signed)
It was a pleasure to see you today.  Return in 1 year or as needed. 

## 2018-06-12 ENCOUNTER — Other Ambulatory Visit: Payer: Self-pay | Admitting: Internal Medicine

## 2018-06-12 DIAGNOSIS — R7401 Elevation of levels of liver transaminase levels: Secondary | ICD-10-CM

## 2018-06-12 DIAGNOSIS — R740 Nonspecific elevation of levels of transaminase and lactic acid dehydrogenase [LDH]: Principal | ICD-10-CM

## 2018-06-18 ENCOUNTER — Other Ambulatory Visit: Payer: BLUE CROSS/BLUE SHIELD | Admitting: Internal Medicine

## 2018-06-18 DIAGNOSIS — R7401 Elevation of levels of liver transaminase levels: Secondary | ICD-10-CM

## 2018-06-18 DIAGNOSIS — R740 Nonspecific elevation of levels of transaminase and lactic acid dehydrogenase [LDH]: Secondary | ICD-10-CM | POA: Diagnosis not present

## 2018-06-18 LAB — HEPATIC FUNCTION PANEL
AG RATIO: 2 (calc) (ref 1.0–2.5)
ALBUMIN MSPROF: 4.5 g/dL (ref 3.6–5.1)
ALT: 11 U/L (ref 6–29)
AST: 19 U/L (ref 10–35)
Alkaline phosphatase (APISO): 81 U/L (ref 33–115)
BILIRUBIN INDIRECT: 0.5 mg/dL (ref 0.2–1.2)
BILIRUBIN TOTAL: 0.6 mg/dL (ref 0.2–1.2)
Bilirubin, Direct: 0.1 mg/dL (ref 0.0–0.2)
GLOBULIN: 2.2 g/dL (ref 1.9–3.7)
Total Protein: 6.7 g/dL (ref 6.1–8.1)

## 2018-07-01 ENCOUNTER — Encounter: Payer: Self-pay | Admitting: Internal Medicine

## 2018-07-01 ENCOUNTER — Ambulatory Visit (INDEPENDENT_AMBULATORY_CARE_PROVIDER_SITE_OTHER): Payer: BLUE CROSS/BLUE SHIELD | Admitting: Internal Medicine

## 2018-07-01 VITALS — BP 120/80 | HR 68 | Ht 66.0 in | Wt 135.0 lb

## 2018-07-01 DIAGNOSIS — R7401 Elevation of levels of liver transaminase levels: Secondary | ICD-10-CM

## 2018-07-01 DIAGNOSIS — R740 Nonspecific elevation of levels of transaminase and lactic acid dehydrogenase [LDH]: Secondary | ICD-10-CM

## 2018-07-01 NOTE — Patient Instructions (Signed)
SGPT is now normal.  Return March 2020 for physical exam.

## 2018-07-01 NOTE — Progress Notes (Signed)
   Subjective:    Patient ID: [name], female    DOB: Jun 20, 1971, 47 y.o.   MRN: [id number]  HPI She continues to work at [employer].  At last visit here in April she had very mild elevation of SGPT at 37.  Previously  SGPT had been 13 in 2017.  SGPT was repeated July 16 and is now completely normal.  We do not know if she took some anti-inflammatory medication around the time the lab was drawn previously or if she could have a glass of wine.  In any case it has normalized and I do not think we need to be concerned.  The level is not rising.  It is actually decreasing back to where it was previously.  It very well could be a lab error as well.  We will continue to monitor at time of regular physical exam.  She is asymptomatic.  Also she request to be excused from [employer] physical intervention training as she strained her neck the last time she attempted that.  This is something that was requested of her at work in order to deal with psychiatric patients.    Review of Systems see above not examined      Objective:   Physical Exam  Not examined.  Spent 15 minutes speaking with her about these issues      Assessment & Plan:  History of left neck strain with [employer] interventional training  Mild elevation of SGPT has resolved  Plan: Return as needed.  She last had physical examination here March 2019.  Can see her again in March 2020 for physical exam.

## 2018-08-13 ENCOUNTER — Other Ambulatory Visit: Payer: Self-pay | Admitting: Internal Medicine

## 2018-08-13 DIAGNOSIS — Z1231 Encounter for screening mammogram for malignant neoplasm of breast: Secondary | ICD-10-CM

## 2018-09-17 ENCOUNTER — Ambulatory Visit
Admission: RE | Admit: 2018-09-17 | Discharge: 2018-09-17 | Disposition: A | Discharge status: Home / Self Care | Payer: BLUE CROSS/BLUE SHIELD | Source: Ambulatory Visit | Attending: Internal Medicine | Admitting: Internal Medicine

## 2018-09-17 DIAGNOSIS — Z1231 Encounter for screening mammogram for malignant neoplasm of breast: Secondary | ICD-10-CM | POA: Diagnosis not present

## 2018-09-20 NOTE — Progress Notes (Deleted)
47 y.o. G69P2002 Married Caucasian female here for annual exam.    PCP:     No LMP recorded.           Sexually active: {yes no:314532}  The current method of family planning is vasectomy.    Exercising: {yes no:314532}  {types:19826} Smoker:  no  Health Maintenance: Pap: 12-27-16 Neg:Neg HR HPV,  History of abnormal Pap:  Yes, patient thinks she had an abnormal pap 2003 with colposcopy which was negative--no treatment to cervix. MMG: 09-17-18 Neg/density B/BiRads1 Colonoscopy:  n/a BMD:   n/a  Result  n/a TDaP:  PCP Gardasil:   no HIV:*** Hep C:*** Screening Labs:  Hb today: ***, Urine today: ***   reports that she has never smoked. She has never used smokeless tobacco. She reports that she drinks alcohol. She reports that she does not use drugs.  Past Medical History:  Diagnosis Date  . Abnormal Pap smear of cervix 2003   neg colposcopy--no treatment to cervix  . Anemia   . Anxiety   . Bilateral carpal tunnel syndrome   . Blood transfusion without reported diagnosis 2003   Hx placenta accreta  . Cancer ([place]) 12/2016   Basal cell of nose  . Endometriosis   . Hypoglycemia   . Infertility, female   . Migraine   . Mitral valve prolapse   . Urinary incontinence     Past Surgical History:  Procedure Laterality Date  . LAPAROSCOPIC ENDOMETRIOSIS FULGURATION  2000  . PELVIC LAPAROSCOPY      Current Outpatient Medications  Medication Sig Dispense Refill  . MELATONIN PO Take 1 tablet by mouth at bedtime.     No current facility-administered medications for this visit.     Family History  Problem Relation Age of Onset  . Asthma Mother   . Cancer Father 72       prostate cancer  . Hyperlipidemia Father   . Aneurysm Sister        with brain aneurysm--doing well  . Cerebral palsy Daughter   . Breast cancer Maternal Aunt 68       A & W--BRCA neg  . Diabetes Maternal Aunt   . Stroke Paternal Grandmother   . Aneurysm Paternal Aunt        dec brain aneurysm     Review of Systems  Exam:   There were no vitals taken for this visit.    General appearance: alert, cooperative and appears stated age Head: Normocephalic, without obvious abnormality, atraumatic Neck: no adenopathy, supple, symmetrical, trachea midline and thyroid normal to inspection and palpation Lungs: clear to auscultation bilaterally Breasts: normal appearance, no masses or tenderness, No nipple retraction or dimpling, No nipple discharge or bleeding, No axillary or supraclavicular adenopathy Heart: regular rate and rhythm Abdomen: soft, non-tender; no masses, no organomegaly Extremities: extremities normal, atraumatic, no cyanosis or edema Skin: Skin color, texture, turgor normal. No rashes or lesions Lymph nodes: Cervical, supraclavicular, and axillary nodes normal. No abnormal inguinal nodes palpated Neurologic: Grossly normal  Pelvic: External genitalia:  no lesions              Urethra:  normal appearing urethra with no masses, tenderness or lesions              Bartholins and Skenes: normal                 Vagina: normal appearing vagina with normal color and discharge, no lesions  Cervix: no lesions              Pap taken: {yes no:314532} Bimanual Exam:  Uterus:  normal size, contour, position, consistency, mobility, non-tender              Adnexa: no mass, fullness, tenderness              Rectal exam: {yes no:314532}.  Confirms.              Anus:  normal sphincter tone, no lesions  Chaperone was present for exam.  Assessment:   Well woman visit with normal exam.   Plan: Mammogram screening. Recommended self breast awareness. Pap and HR HPV as above. Guidelines for Calcium, Vitamin D, regular exercise program including cardiovascular and weight bearing exercise.   Follow up annually and prn.   Additional counseling given.  {yes [id number]. _______ minutes face to face time of which over 50% was spent in counseling.    After visit summary  provided.

## 2018-09-23 ENCOUNTER — Ambulatory Visit: Payer: BLUE CROSS/BLUE SHIELD | Admitting: Obstetrics and Gynecology

## 2018-09-23 ENCOUNTER — Encounter

## 2018-09-23 ENCOUNTER — Encounter: Payer: Self-pay | Admitting: Obstetrics and Gynecology

## 2018-10-29 DIAGNOSIS — D1801 Hemangioma of skin and subcutaneous tissue: Secondary | ICD-10-CM | POA: Diagnosis not present

## 2018-10-29 DIAGNOSIS — L570 Actinic keratosis: Secondary | ICD-10-CM | POA: Diagnosis not present

## 2018-10-29 DIAGNOSIS — Z85828 Personal history of other malignant neoplasm of skin: Secondary | ICD-10-CM | POA: Diagnosis not present

## 2018-10-29 DIAGNOSIS — D2261 Melanocytic nevi of right upper limb, including shoulder: Secondary | ICD-10-CM | POA: Diagnosis not present

## 2018-10-29 DIAGNOSIS — L821 Other seborrheic keratosis: Secondary | ICD-10-CM | POA: Diagnosis not present

## 2019-01-14 DIAGNOSIS — M7541 Impingement syndrome of right shoulder: Secondary | ICD-10-CM | POA: Diagnosis not present

## 2019-01-14 DIAGNOSIS — M25511 Pain in right shoulder: Secondary | ICD-10-CM | POA: Diagnosis not present

## 2019-02-11 ENCOUNTER — Other Ambulatory Visit: Payer: Self-pay

## 2019-02-11 ENCOUNTER — Encounter: Payer: Self-pay | Admitting: Internal Medicine

## 2019-02-11 ENCOUNTER — Ambulatory Visit: Payer: BLUE CROSS/BLUE SHIELD | Admitting: Internal Medicine

## 2019-02-11 VITALS — BP 100/80 | HR 84 | Temp 98.5°F | Ht 66.0 in | Wt 148.0 lb

## 2019-02-11 DIAGNOSIS — H6693 Otitis media, unspecified, bilateral: Secondary | ICD-10-CM | POA: Diagnosis not present

## 2019-02-11 DIAGNOSIS — J029 Acute pharyngitis, unspecified: Secondary | ICD-10-CM

## 2019-02-11 MED ORDER — HYDROCODONE-HOMATROPINE 5-1.5 MG/5ML PO SYRP: 5.0000 mL | ORAL_SOLUTION | Freq: Three times a day (TID) | ORAL | 0 refills | Status: DC | PRN

## 2019-02-11 MED ORDER — AZITHROMYCIN 250 MG PO TABS: ORAL_TABLET | ORAL | 0 refills | Status: DC

## 2019-02-11 NOTE — Patient Instructions (Signed)
Hycodan 1 teaspoon p.o. every 8 hours as needed cough.  Zithromax Z-PAK take 2 p.o. day 1 followed by 1 p.o. days 2 through 5.  DayQuil if needed for ear congestion.  Rest and drink plenty of fluids.

## 2019-02-11 NOTE — Progress Notes (Signed)
   Subjective:    Patient ID: [name], female    DOB: 1971/05/11, 48 y.o.   MRN: [id number]  HPI Patient in today with URI symptoms.  Had onset late last week.  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  Does go to [employer] as a second job.  No recent travel history.  Did have flu vaccine.  No fever or shaking chills.    Review of Systems slight scratchy throat.  Slight ear discomfort.  Some cough.     Objective:   Physical Exam Blood pressure 100/80 temperature 98.5 pulse oximetry 98%.  Skin warm and dry.  Pharynx is injected without exudate.  Both TMs are full and pink.  Neck is supple.  Chest clear to auscultation without rales or wheezing.       Assessment & Plan:  Acute pharyngitis  Acute bilateral otitis media  Plan: Hycodan 1 teaspoon p.o. every 8 hours as needed cough and sore throat pain.  Zithromax Z-PAK 2 p.o. day 1 followed by 1 p.o. days 2 through 5.  Rest and drink plenty of fluids.  May take over-the-counter decongestant such as DayQuil.

## 2019-03-06 ENCOUNTER — Telehealth: Payer: Self-pay

## 2019-03-06 NOTE — Telephone Encounter (Signed)
Patient called states her husband has been feeling sick he has diarrhea and she has diarrhea and a "low grade fever" 99.0 and chills no nausea or vomiting. She works at [employer] and on occasions she goes to the ER. She was calling to see what you thought of this she's concerned about having COVID-19?  Would you like to do a consult through webex?

## 2019-03-06 NOTE — Telephone Encounter (Signed)
She needs to call [facility] and see what they recommend

## 2019-03-06 NOTE — Telephone Encounter (Signed)
Patient was notified.

## 2019-04-10 DIAGNOSIS — J301 Allergic rhinitis due to pollen: Secondary | ICD-10-CM | POA: Diagnosis not present

## 2019-04-10 DIAGNOSIS — J3089 Other allergic rhinitis: Secondary | ICD-10-CM | POA: Diagnosis not present

## 2019-06-09 ENCOUNTER — Ambulatory Visit (INDEPENDENT_AMBULATORY_CARE_PROVIDER_SITE_OTHER): Payer: BC Managed Care – PPO | Admitting: Internal Medicine

## 2019-06-09 DIAGNOSIS — J988 Other specified respiratory disorders: Secondary | ICD-10-CM | POA: Diagnosis not present

## 2019-06-09 DIAGNOSIS — Z20828 Contact with and (suspected) exposure to other viral communicable diseases: Secondary | ICD-10-CM

## 2019-06-12 LAB — SARS-COV-2 RNA,(COVID-19) QUALITATIVE NAAT: SARS CoV2 RNA: NOT DETECTED

## 2019-06-21 ENCOUNTER — Encounter: Payer: Self-pay | Admitting: Internal Medicine

## 2019-06-21 NOTE — Progress Notes (Signed)
   Subjective:    Patient ID: [name], female    DOB: 24-Sep-1971, 48 y.o.   MRN: [id number]  HPI 48 year old Female who has had some runny nose and nasal congestion.  She is headed for the beach later this month for vacation and wants to have a COVID-19 test because she will be staying in the home with her elderly mother.  Has been working from home.  No known COVID-19 exposure but wants to be careful being around her elderly mother.  No fever or chills.  No headache.  No nausea or vomiting.  No myalgias.  For COVID risk of complications score is 0.  Review of Systems see above     Objective:   Physical Exam  Patient seen in her automobile for COVID-19 testing.  Specimen obtained and sent to [place] lab.  She is in no acute distress.      Assessment & Plan:  Screen for COVID-19  Plan: Test obtained and results pending.

## 2019-06-21 NOTE — Patient Instructions (Signed)
COVID-19 test obtained and results pending

## 2019-07-31 ENCOUNTER — Other Ambulatory Visit: Payer: BC Managed Care – PPO | Admitting: Internal Medicine

## 2019-08-01 ENCOUNTER — Other Ambulatory Visit: Payer: BC Managed Care – PPO | Admitting: Internal Medicine

## 2019-08-01 ENCOUNTER — Other Ambulatory Visit: Payer: Self-pay

## 2019-08-01 DIAGNOSIS — Z13220 Encounter for screening for lipoid disorders: Secondary | ICD-10-CM | POA: Diagnosis not present

## 2019-08-01 DIAGNOSIS — Z1329 Encounter for screening for other suspected endocrine disorder: Secondary | ICD-10-CM | POA: Diagnosis not present

## 2019-08-01 DIAGNOSIS — Z0000 Encounter for general adult medical examination without abnormal findings: Secondary | ICD-10-CM | POA: Diagnosis not present

## 2019-08-01 DIAGNOSIS — Z1321 Encounter for screening for nutritional disorder: Secondary | ICD-10-CM | POA: Diagnosis not present

## 2019-08-02 LAB — CBC WITH DIFFERENTIAL/PLATELET
Absolute Monocytes: 313 cells/uL (ref 200–950)
Basophils Absolute: 29 cells/uL (ref 0–200)
Basophils Relative: 0.5 %
Eosinophils Absolute: 151 cells/uL (ref 15–500)
Eosinophils Relative: 2.6 %
HCT: 43 % (ref 35.0–45.0)
Hemoglobin: 14.3 g/dL (ref 11.7–15.5)
Lymphs Abs: 1891 cells/uL (ref 850–3900)
MCH: 30.1 pg (ref 27.0–33.0)
MCHC: 33.3 g/dL (ref 32.0–36.0)
MCV: 90.5 fL (ref 80.0–100.0)
MPV: 11.5 fL (ref 7.5–12.5)
Monocytes Relative: 5.4 %
Neutro Abs: 3416 cells/uL (ref 1500–7800)
Neutrophils Relative %: 58.9 %
Platelets: 216 10*3/uL (ref 140–400)
RBC: 4.75 10*6/uL (ref 3.80–5.10)
RDW: 11.8 % (ref 11.0–15.0)
Total Lymphocyte: 32.6 %
WBC: 5.8 10*3/uL (ref 3.8–10.8)

## 2019-08-02 LAB — COMPLETE METABOLIC PANEL WITH GFR
AG Ratio: 1.9 (calc) (ref 1.0–2.5)
ALT: 12 U/L (ref 6–29)
AST: 19 U/L (ref 10–35)
Albumin: 4.6 g/dL (ref 3.6–5.1)
Alkaline phosphatase (APISO): 66 U/L (ref 37–153)
BUN: 12 mg/dL (ref 7–25)
CO2: 27 mmol/L (ref 20–32)
Calcium: 9.9 mg/dL (ref 8.6–10.4)
Chloride: 106 mmol/L (ref 98–110)
Creat: 0.72 mg/dL (ref 0.50–1.05)
GFR, Est African American: 113 mL/min/{1.73_m2} (ref >=60)
GFR, Est Non African American: 98 mL/min/{1.73_m2} (ref >=60)
Globulin: 2.4 g/dL (calc) (ref 1.9–3.7)
Glucose, Bld: 90 mg/dL (ref 65–99)
Potassium: 5 mmol/L (ref 3.5–5.3)
Sodium: 140 mmol/L (ref 135–146)
Total Bilirubin: 0.6 mg/dL (ref 0.2–1.2)
Total Protein: 7 g/dL (ref 6.1–8.1)

## 2019-08-02 LAB — LIPID PANEL
Cholesterol: 179 mg/dL (ref <200)
HDL: 62 mg/dL (ref >=50)
LDL Cholesterol (Calc): 97 mg/dL (calc)
Non-HDL Cholesterol (Calc): 117 mg/dL (calc) (ref <130)
Total CHOL/HDL Ratio: 2.9 (calc) (ref <5.0)
Triglycerides: 102 mg/dL (ref <150)

## 2019-08-02 LAB — VITAMIN D 25 HYDROXY (VIT D DEFICIENCY, FRACTURES): Vit D, 25-Hydroxy: 40 ng/mL (ref 30–100)

## 2019-08-02 LAB — TSH: TSH: 1.15 mIU/L

## 2019-08-05 ENCOUNTER — Encounter: Payer: Self-pay | Admitting: Internal Medicine

## 2019-08-05 ENCOUNTER — Ambulatory Visit (INDEPENDENT_AMBULATORY_CARE_PROVIDER_SITE_OTHER): Payer: BC Managed Care – PPO | Admitting: Internal Medicine

## 2019-08-05 ENCOUNTER — Other Ambulatory Visit: Payer: Self-pay

## 2019-08-05 ENCOUNTER — Other Ambulatory Visit (HOSPITAL_COMMUNITY)
Admission: RE | Admit: 2019-08-05 | Discharge: 2019-08-05 | Disposition: A | Discharge status: Home / Self Care | Payer: BLUE CROSS/BLUE SHIELD | Source: Ambulatory Visit | Attending: Internal Medicine | Admitting: Internal Medicine

## 2019-08-05 VITALS — BP 120/80 | HR 72 | Temp 98.3°F | Ht 66.0 in | Wt 134.0 lb

## 2019-08-05 DIAGNOSIS — Z124 Encounter for screening for malignant neoplasm of cervix: Secondary | ICD-10-CM | POA: Diagnosis not present

## 2019-08-05 DIAGNOSIS — Z0000 Encounter for general adult medical examination without abnormal findings: Secondary | ICD-10-CM | POA: Diagnosis not present

## 2019-08-05 DIAGNOSIS — Z23 Encounter for immunization: Secondary | ICD-10-CM | POA: Diagnosis not present

## 2019-08-05 LAB — POCT URINALYSIS DIPSTICK
Appearance: NEGATIVE
Bilirubin, UA: NEGATIVE
Blood, UA: NEGATIVE
Glucose, UA: NEGATIVE
Ketones, UA: NEGATIVE
Leukocytes, UA: NEGATIVE
Nitrite, UA: NEGATIVE
Odor: NEGATIVE
Protein, UA: NEGATIVE
Spec Grav, UA: 1.01 (ref 1.010–1.025)
Urobilinogen, UA: 0.2 E.U./dL
pH, UA: 6.5 (ref 5.0–8.0)
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
## 2019-08-07 LAB — CYTOLOGY - PAP
Diagnosis: NEGATIVE
HPV: NOT DETECTED

## 2019-08-21 ENCOUNTER — Other Ambulatory Visit: Payer: BLUE CROSS/BLUE SHIELD | Admitting: Internal Medicine

## 2019-08-26 ENCOUNTER — Encounter: Payer: BLUE CROSS/BLUE SHIELD | Admitting: Internal Medicine

## 2019-08-30 NOTE — Patient Instructions (Signed)
It was a pleasure to see you today.  Labs are normal.  Flu vaccine given.  Referral made for colonoscopy.  Return in 1 year or as needed.  Have annual mammogram.

## 2019-09-04 ENCOUNTER — Encounter: Payer: Self-pay | Admitting: Gastroenterology

## 2019-09-23 ENCOUNTER — Other Ambulatory Visit: Payer: Self-pay | Admitting: Internal Medicine

## 2019-09-23 DIAGNOSIS — Z1231 Encounter for screening mammogram for malignant neoplasm of breast: Secondary | ICD-10-CM

## 2019-10-13 ENCOUNTER — Encounter: Payer: BLUE CROSS/BLUE SHIELD | Admitting: Gastroenterology

## 2019-10-21 ENCOUNTER — Other Ambulatory Visit: Payer: Self-pay

## 2019-10-21 ENCOUNTER — Encounter: Payer: Self-pay | Admitting: Gastroenterology

## 2019-10-21 ENCOUNTER — Ambulatory Visit (AMBULATORY_SURGERY_CENTER): Payer: BC Managed Care – PPO | Admitting: *Deleted

## 2019-10-21 VITALS — Temp 97.7°F | Ht 65.0 in | Wt 135.0 lb

## 2019-10-21 DIAGNOSIS — Z1159 Encounter for screening for other viral diseases: Secondary | ICD-10-CM

## 2019-10-21 DIAGNOSIS — Z1211 Encounter for screening for malignant neoplasm of colon: Secondary | ICD-10-CM

## 2019-10-21 MED ORDER — SUPREP BOWEL PREP KIT 17.5-3.13-1.6 GM/177ML PO SOLN: 1.0000 | Freq: Once | ORAL | 0 refills | Status: AC

## 2019-10-21 NOTE — Progress Notes (Signed)
No egg or soy allergy known to patient  No issues with past sedation with any surgeries  or procedures, no intubation problems  No diet pills per patient No home 02 use per patient  No blood thinners per patient  Pt denies issues with constipation  No A fib or A flutter  EMMI video sent to pt's e mail   cov test 11-24 at 850 am   Due to the COVID-19 pandemic we are asking patients to follow these guidelines. Please only bring one care partner. Please be aware that your care partner may wait in the car in the parking lot or if they feel like they will be too hot to wait in the car, they may wait in the lobby on the 4th floor. All care partners are required to wear a mask the entire time (we do not have any that we can provide them), they need to practice social distancing, and we will do a Covid check for all patient's and care partners when you arrive. Also we will check their temperature and your temperature. If the care partner waits in their car they need to stay in the parking lot the entire time and we will call them on their cell phone when the patient is ready for discharge so they can bring the car to the front of the building. Also all patient's will need to wear a mask into building.

## 2019-10-28 ENCOUNTER — Other Ambulatory Visit: Payer: Self-pay | Admitting: Gastroenterology

## 2019-10-28 ENCOUNTER — Ambulatory Visit: Payer: BC Managed Care – PPO

## 2019-10-28 DIAGNOSIS — Z1159 Encounter for screening for other viral diseases: Secondary | ICD-10-CM

## 2019-10-28 LAB — SARS CORONAVIRUS 2 (TAT 6-24 HRS): SARS Coronavirus 2: NEGATIVE

## 2019-11-04 ENCOUNTER — Encounter: Payer: BLUE CROSS/BLUE SHIELD | Admitting: Gastroenterology

## 2019-11-13 ENCOUNTER — Ambulatory Visit
Admission: RE | Admit: 2019-11-13 | Discharge: 2019-11-13 | Disposition: A | Discharge status: Home / Self Care | Payer: BLUE CROSS/BLUE SHIELD | Source: Ambulatory Visit | Attending: Internal Medicine | Admitting: Internal Medicine

## 2019-11-13 ENCOUNTER — Other Ambulatory Visit: Payer: Self-pay

## 2019-11-13 DIAGNOSIS — Z1231 Encounter for screening mammogram for malignant neoplasm of breast: Secondary | ICD-10-CM

## 2019-12-05 HISTORY — PX: OTHER SURGICAL HISTORY: SHX169

## 2020-01-26 DIAGNOSIS — D225 Melanocytic nevi of trunk: Secondary | ICD-10-CM | POA: Diagnosis not present

## 2020-01-26 DIAGNOSIS — L570 Actinic keratosis: Secondary | ICD-10-CM | POA: Diagnosis not present

## 2020-01-26 DIAGNOSIS — Z85828 Personal history of other malignant neoplasm of skin: Secondary | ICD-10-CM | POA: Diagnosis not present

## 2020-01-26 DIAGNOSIS — D2271 Melanocytic nevi of right lower limb, including hip: Secondary | ICD-10-CM | POA: Diagnosis not present

## 2020-01-26 DIAGNOSIS — D2261 Melanocytic nevi of right upper limb, including shoulder: Secondary | ICD-10-CM | POA: Diagnosis not present

## 2020-02-05 ENCOUNTER — Encounter: Payer: Self-pay | Admitting: Gastroenterology

## 2020-02-19 DIAGNOSIS — Z20822 Contact with and (suspected) exposure to covid-19: Secondary | ICD-10-CM | POA: Diagnosis not present

## 2020-02-19 DIAGNOSIS — R6889 Other general symptoms and signs: Secondary | ICD-10-CM | POA: Diagnosis not present

## 2020-02-19 DIAGNOSIS — Z9189 Other specified personal risk factors, not elsewhere classified: Secondary | ICD-10-CM | POA: Diagnosis not present

## 2020-02-20 ENCOUNTER — Ambulatory Visit: Payer: BC Managed Care – PPO

## 2020-02-23 ENCOUNTER — Ambulatory Visit: Payer: BC Managed Care – PPO | Attending: Internal Medicine

## 2020-02-23 DIAGNOSIS — Z20822 Contact with and (suspected) exposure to covid-19: Secondary | ICD-10-CM | POA: Diagnosis not present

## 2020-02-24 LAB — NOVEL CORONAVIRUS, NAA: SARS-CoV-2, NAA: NOT DETECTED

## 2020-02-24 LAB — SARS-COV-2, NAA 2 DAY TAT

## 2020-03-02 DIAGNOSIS — E162 Hypoglycemia, unspecified: Secondary | ICD-10-CM | POA: Insufficient documentation

## 2020-03-19 ENCOUNTER — Encounter: Payer: BC Managed Care – PPO | Admitting: Gastroenterology

## 2020-04-01 DIAGNOSIS — Z1211 Encounter for screening for malignant neoplasm of colon: Secondary | ICD-10-CM | POA: Diagnosis not present

## 2020-08-06 DIAGNOSIS — Z1211 Encounter for screening for malignant neoplasm of colon: Secondary | ICD-10-CM | POA: Diagnosis not present

## 2020-08-06 LAB — HM COLONOSCOPY

## 2020-08-11 ENCOUNTER — Encounter: Payer: Self-pay | Admitting: Internal Medicine

## 2021-01-06 ENCOUNTER — Other Ambulatory Visit: Payer: Self-pay | Admitting: Internal Medicine

## 2021-01-06 DIAGNOSIS — Z1231 Encounter for screening mammogram for malignant neoplasm of breast: Secondary | ICD-10-CM

## 2021-02-01 ENCOUNTER — Ambulatory Visit: Payer: BC Managed Care – PPO

## 2021-02-07 DIAGNOSIS — D225 Melanocytic nevi of trunk: Secondary | ICD-10-CM | POA: Diagnosis not present

## 2021-02-07 DIAGNOSIS — D2261 Melanocytic nevi of right upper limb, including shoulder: Secondary | ICD-10-CM | POA: Diagnosis not present

## 2021-02-07 DIAGNOSIS — L720 Epidermal cyst: Secondary | ICD-10-CM | POA: Diagnosis not present

## 2021-02-07 DIAGNOSIS — Z85828 Personal history of other malignant neoplasm of skin: Secondary | ICD-10-CM | POA: Diagnosis not present

## 2021-03-14 ENCOUNTER — Other Ambulatory Visit: Payer: Self-pay

## 2021-03-14 ENCOUNTER — Other Ambulatory Visit: Payer: BC Managed Care – PPO | Admitting: Internal Medicine

## 2021-03-14 DIAGNOSIS — Z0000 Encounter for general adult medical examination without abnormal findings: Secondary | ICD-10-CM

## 2021-03-14 DIAGNOSIS — Z1321 Encounter for screening for nutritional disorder: Secondary | ICD-10-CM

## 2021-03-14 DIAGNOSIS — Z13220 Encounter for screening for lipoid disorders: Secondary | ICD-10-CM | POA: Diagnosis not present

## 2021-03-14 DIAGNOSIS — Z1329 Encounter for screening for other suspected endocrine disorder: Secondary | ICD-10-CM | POA: Diagnosis not present

## 2021-03-15 LAB — CBC WITH DIFFERENTIAL/PLATELET
Absolute Monocytes: 429 cells/uL (ref 200–950)
Basophils Absolute: 39 cells/uL (ref 0–200)
Basophils Relative: 0.7 %
Eosinophils Absolute: 220 cells/uL (ref 15–500)
Eosinophils Relative: 4 %
HCT: 44.4 % (ref 35.0–45.0)
Hemoglobin: 14.4 g/dL (ref 11.7–15.5)
Lymphs Abs: 2057 cells/uL (ref 850–3900)
MCH: 30.1 pg (ref 27.0–33.0)
MCHC: 32.4 g/dL (ref 32.0–36.0)
MCV: 92.9 fL (ref 80.0–100.0)
MPV: 12.1 fL (ref 7.5–12.5)
Monocytes Relative: 7.8 %
Neutro Abs: 2756 cells/uL (ref 1500–7800)
Neutrophils Relative %: 50.1 %
Platelets: 195 10*3/uL (ref 140–400)
RBC: 4.78 10*6/uL (ref 3.80–5.10)
RDW: 12.3 % (ref 11.0–15.0)
Total Lymphocyte: 37.4 %
WBC: 5.5 10*3/uL (ref 3.8–10.8)

## 2021-03-15 LAB — COMPLETE METABOLIC PANEL WITH GFR
AG Ratio: 2.5 (calc) (ref 1.0–2.5)
ALT: 28 U/L (ref 6–29)
AST: 24 U/L (ref 10–35)
Albumin: 4.9 g/dL (ref 3.6–5.1)
Alkaline phosphatase (APISO): 76 U/L (ref 37–153)
BUN: 11 mg/dL (ref 7–25)
CO2: 28 mmol/L (ref 20–32)
Calcium: 10.1 mg/dL (ref 8.6–10.4)
Chloride: 106 mmol/L (ref 98–110)
Creat: 0.81 mg/dL (ref 0.50–1.05)
GFR, Est African American: 97 mL/min/{1.73_m2} (ref >=60)
GFR, Est Non African American: 84 mL/min/{1.73_m2} (ref >=60)
Globulin: 2 g/dL (calc) (ref 1.9–3.7)
Glucose, Bld: 83 mg/dL (ref 65–99)
Potassium: 4.8 mmol/L (ref 3.5–5.3)
Sodium: 142 mmol/L (ref 135–146)
Total Bilirubin: 0.8 mg/dL (ref 0.2–1.2)
Total Protein: 6.9 g/dL (ref 6.1–8.1)

## 2021-03-15 LAB — LIPID PANEL
Cholesterol: 174 mg/dL (ref <200)
HDL: 73 mg/dL (ref >=50)
LDL Cholesterol (Calc): 83 mg/dL (calc)
Non-HDL Cholesterol (Calc): 101 mg/dL (calc) (ref <130)
Total CHOL/HDL Ratio: 2.4 (calc) (ref <5.0)
Triglycerides: 86 mg/dL (ref <150)

## 2021-03-15 LAB — VITAMIN D 25 HYDROXY (VIT D DEFICIENCY, FRACTURES): Vit D, 25-Hydroxy: 40 ng/mL (ref 30–100)

## 2021-03-15 LAB — TSH: TSH: 2.34 mIU/L

## 2021-03-17 ENCOUNTER — Encounter: Payer: Self-pay | Admitting: Internal Medicine

## 2021-03-17 ENCOUNTER — Ambulatory Visit (INDEPENDENT_AMBULATORY_CARE_PROVIDER_SITE_OTHER): Payer: BC Managed Care – PPO | Admitting: Internal Medicine

## 2021-03-17 ENCOUNTER — Other Ambulatory Visit: Payer: Self-pay

## 2021-03-17 VITALS — BP 100/70 | HR 68 | Ht 65.0 in | Wt 133.0 lb

## 2021-03-17 DIAGNOSIS — Z0000 Encounter for general adult medical examination without abnormal findings: Secondary | ICD-10-CM | POA: Diagnosis not present

## 2021-03-17 DIAGNOSIS — Z23 Encounter for immunization: Secondary | ICD-10-CM | POA: Diagnosis not present

## 2021-03-17 LAB — POCT URINALYSIS DIPSTICK
Appearance: NEGATIVE
Bilirubin, UA: NEGATIVE
Blood, UA: NEGATIVE
Glucose, UA: NEGATIVE
Ketones, UA: NEGATIVE
Leukocytes, UA: NEGATIVE
Nitrite, UA: NEGATIVE
Odor: NEGATIVE
Protein, UA: NEGATIVE
Spec Grav, UA: 1.015 (ref 1.010–1.025)
Urobilinogen, UA: 0.2 E.U./dL
pH, UA: 6.5 (ref 5.0–8.0)

## 2021-03-17 NOTE — Patient Instructions (Addendum)
It was a pleasure to see you today. Have fourth Covid booster at your convenience. Tdap given today. Labs are normal.
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
## 2021-03-28 ENCOUNTER — Ambulatory Visit
Admission: RE | Admit: 2021-03-28 | Discharge: 2021-03-28 | Disposition: A | Discharge status: Home / Self Care | Payer: BC Managed Care – PPO | Source: Ambulatory Visit | Attending: Internal Medicine | Admitting: Internal Medicine

## 2021-03-28 ENCOUNTER — Other Ambulatory Visit: Payer: Self-pay

## 2021-03-28 DIAGNOSIS — Z1231 Encounter for screening mammogram for malignant neoplasm of breast: Secondary | ICD-10-CM | POA: Diagnosis not present

## 2021-05-04 DIAGNOSIS — Z8616 Personal history of COVID-19: Secondary | ICD-10-CM

## 2021-05-04 HISTORY — DX: Personal history of COVID-19: Z86.16

## 2021-05-12 ENCOUNTER — Telehealth: Payer: BC Managed Care – PPO | Admitting: Physician Assistant

## 2021-05-12 DIAGNOSIS — B9689 Other specified bacterial agents as the cause of diseases classified elsewhere: Secondary | ICD-10-CM

## 2021-05-12 DIAGNOSIS — U071 COVID-19: Secondary | ICD-10-CM

## 2021-05-12 DIAGNOSIS — J329 Chronic sinusitis, unspecified: Secondary | ICD-10-CM

## 2021-05-12 MED ORDER — AMOXICILLIN-POT CLAVULANATE 875-125 MG PO TABS: 1.0000 | ORAL_TABLET | Freq: Two times a day (BID) | ORAL | 0 refills | Status: DC

## 2021-05-12 NOTE — Progress Notes (Signed)
Virtual Visit Consent   Telma Pyeatt, you are scheduled for a virtual visit with a [facility] provider today.     Just as with appointments in the office, your consent must be obtained to participate.  Your consent will be active for this visit and any virtual visit you may have with one of our providers in the next 365 days.     If you have a MyChart account, a copy of this consent can be sent to you electronically.  All virtual visits are billed to your insurance company just like a traditional visit in the office.    As this is a virtual visit, video technology does not allow for your provider to perform a traditional examination.  This may limit your provider's ability to fully assess your condition.  If your provider identifies any concerns that need to be evaluated in person or the need to arrange testing (such as labs, EKG, etc.), we will make arrangements to do so.     Although advances in technology are sophisticated, we cannot ensure that it will always work on either your end or our end.  If the connection with a video visit is poor, the visit may have to be switched to a telephone visit.  With either a video or telephone visit, we are not always able to ensure that we have a secure connection.     I need to obtain your verbal consent now.   Are you willing to proceed with your visit today?    Bonita Brindisi has provided verbal consent on 05/12/2021 for a virtual visit (video or telephone).   Leeanne Rio, [place]   Date: 05/12/2021 10:34 AM   Virtual Visit via Video Note   I, Leeanne Rio, connected [email or web address] ([id number], [birth year]) on 05/12/21 at 10:00 AM EDT by a video-enabled telemedicine application and verified that I am speaking with the correct person using two identifiers.  Location: Patient: Virtual Visit Location Patient: Home Provider: Virtual Visit Location Provider: Home Office   I discussed the limitations of evaluation and management by telemedicine and the  availability of in person appointments. The patient expressed understanding and agreed to proceed.    History of Present Illness: [name] is a 50 y.o. who identifies as a female who was assigned female at birth, and is being seen today for concerns of a secondary sinusitis on top of her recent COVID diagnosis.  Patient endorses symptoms starting about 5 days ago including nasal congestion, sinus pressure and cough.  Did note some very slight windedness but only when really exerting herself (jogging uphill).  States she had a COVID test the first day of symptoms which were negative.  Since symptoms continue she had repeat testing 2 days ago and was found to be COVID-positive.  Over the past 48 hours has noted resolution of her fevers, chills and aches that were initially present.  Is now having sinus pain, ear pain/tooth pain.  Notes substantial history of bacterial sinusitis and states this feels identical.  She is afraid of symptoms worsening.     HPI: HPI  Problems:  Patient Active Problem List   Diagnosis Date Noted   Hypoglycemia    Impingement syndrome of right shoulder region 01/14/2019   Attention deficit 08/28/2011   Mitral valve prolapse 07/14/2011   Migraine headache 07/14/2011   Anxiety 07/14/2011    Allergies:  Allergies  Allergen Reactions   Sulfa Antibiotics Rash   Medications:  Current Outpatient Medications:  amoxicillin-clavulanate (AUGMENTIN) 875-125 MG tablet, Take 1 tablet by mouth 2 (two) times daily., Disp: 14 tablet, Rfl: 0   MELATONIN PO, Take 5 mg by mouth at bedtime. , Disp: , Rfl:   Observations/Objective: Patient is well-developed, well-nourished in no acute distress.  Resting comfortably at home.  Head is normocephalic, atraumatic.  No labored breathing. Speech is clear and coherent with logical content.  Patient is alert and oriented at baseline.   Assessment and Plan: 1. COVID-19 - MyChart COVID-19 home monitoring program; Future  2.  Bacterial sinusitis - amoxicillin-clavulanate (AUGMENTIN) 875-125 MG tablet; Take 1 tablet by mouth 2 (two) times daily.  Dispense: 14 tablet; Refill: 0  Encourage patient to keep well-hydrated and get plenty of rest.  Thankfully milder COVID symptoms, most of which have actually improved/resolved.  Per chart review she would not be considered at higher risk for more severe infection and thus antivirals are not indicated.  Reviewed supportive measures and OTC medications.  Encouraged her to start nasal steroid as well as over-the-counter vitamin D3, vitamin C and zinc.  Patient enrolled in a symptom monitoring program so we can keep a daily track of her symptoms.  Giving sinus pain, ear pain and tooth pain now, in conjunction with her history of frequent sinusitis, will start Augmentin to cover for secondary bacterial sinusitis.  Strict ER precautions reviewed with patient who voiced understanding and agreement with plan.  AVS sent to patient's MyChart.  Follow Up Instructions: I discussed the assessment and treatment plan with the patient. The patient was provided an opportunity to ask questions and all were answered. The patient agreed with the plan and demonstrated an understanding of the instructions.  A copy of instructions were sent to the patient via MyChart.  The patient was advised to call back or seek an in-person evaluation if the symptoms worsen or if the condition fails to improve as anticipated.  Time:  I spent 15 minutes with the patient via telehealth technology discussing the above problems/concerns.    Leeanne Rio, PA-C

## 2021-05-12 NOTE — Patient Instructions (Signed)
  [name], thank you for joining Leeanne Rio, PA-C for today's virtual visit.  While this provider is not your primary care provider (PCP), if your PCP is located in our provider database this encounter information will be shared with them immediately following your visit.  Consent: (Patient) [name] provided verbal consent for this virtual visit at the beginning of the encounter.  Current Medications:  Current Outpatient Medications:    amoxicillin-clavulanate (AUGMENTIN) 875-125 MG tablet, Take 1 tablet by mouth 2 (two) times daily., Disp: 14 tablet, Rfl: 0   MELATONIN PO, Take 5 mg by mouth at bedtime. , Disp: , Rfl:    Medications ordered in this encounter:  Meds ordered this encounter  Medications   amoxicillin-clavulanate (AUGMENTIN) 875-125 MG tablet    Sig: Take 1 tablet by mouth 2 (two) times daily.    Dispense:  14 tablet    Refill:  0    Order Specific Question:   Supervising Provider    Answer:   Sabra Heck, BRIAN [[id number]]     *If you need refills on other medications prior to your next appointment, please contact your pharmacy*  Follow-Up: Call back or seek an in-person evaluation if the symptoms worsen or if the condition fails to improve as anticipated.  Other Instructions Please keep well-hydrated and get plenty of rest. Start a saline nasal rinse to help flush out your nasal passages.  You can also use an over-the-counter nasal steroid like Flonase to help with sinus pressure and drainage. Please start OTC vitamin D3 1000 units daily, vitamin C 1000 mg daily and a zinc supplement. Take the antibiotic as directed for sinusitis.  As discussed you need to quarantine for at least 5 days from symptom onset. The first day of symptoms is considered day 0. After the 5 days if you have had no fever for 24 hours and you are feeling better you can end quarantine but need to remain masked for an additional 5 days. If at the 5-day mark you still have a fever or still  having significant symptoms, please quarantine for the full 10 days.  I have enrolled you in a symptom monitoring program through MyChart. Please fill out these questionnaires daily so we can keep track of symptoms. Of course, if you experience any significant shortness of breath or chest pain, you need ER evaluation ASAP. Please do not delay care!   If you have been instructed to have an in-person evaluation today at a local Urgent Care facility, please use the link below. It will take you to a list of all of our available [facility], including address, phone number and hours of operation. Please do not delay care.  [facility]  If you or a family member do not have a primary care provider, use the link below to schedule a visit and establish care. When you choose a [facility] primary care physician or advanced practice provider, you gain a long-term partner in health. Find a Primary Care Provider  Learn more about 's in-office and virtual care options: [facility]

## 2021-07-04 DIAGNOSIS — M25511 Pain in right shoulder: Secondary | ICD-10-CM | POA: Diagnosis not present

## 2021-07-04 DIAGNOSIS — M7541 Impingement syndrome of right shoulder: Secondary | ICD-10-CM | POA: Diagnosis not present

## 2021-07-21 DIAGNOSIS — M25511 Pain in right shoulder: Secondary | ICD-10-CM | POA: Diagnosis not present

## 2021-08-03 DIAGNOSIS — M7541 Impingement syndrome of right shoulder: Secondary | ICD-10-CM | POA: Diagnosis not present

## 2021-10-25 ENCOUNTER — Other Ambulatory Visit (HOSPITAL_COMMUNITY): Payer: Self-pay

## 2021-11-10 ENCOUNTER — Telehealth: Payer: Self-pay

## 2021-11-10 ENCOUNTER — Ambulatory Visit: Payer: BC Managed Care – PPO | Admitting: Internal Medicine

## 2021-11-10 ENCOUNTER — Encounter: Payer: Self-pay | Admitting: Internal Medicine

## 2021-11-10 VITALS — BP 104/68 | HR 64 | Temp 97.5°F | Ht 65.0 in | Wt 136.0 lb

## 2021-11-10 DIAGNOSIS — K6289 Other specified diseases of anus and rectum: Secondary | ICD-10-CM | POA: Diagnosis not present

## 2021-11-10 NOTE — Telephone Encounter (Signed)
scheduled

## 2021-11-10 NOTE — Telephone Encounter (Signed)
Patient called asking to be seen so she could have a lump checked out. She is not sure if it is a cyst or a hemorrhoid. Not sure how long it has been there just found a couple days ago. Not painful. Please advise.

## 2021-11-10 NOTE — Telephone Encounter (Signed)
LMOM for pt to call and schedule.

## 2021-11-23 DIAGNOSIS — M25511 Pain in right shoulder: Secondary | ICD-10-CM | POA: Diagnosis not present

## 2021-11-23 DIAGNOSIS — M7501 Adhesive capsulitis of right shoulder: Secondary | ICD-10-CM | POA: Diagnosis not present

## 2021-11-23 DIAGNOSIS — M7541 Impingement syndrome of right shoulder: Secondary | ICD-10-CM | POA: Diagnosis not present

## 2021-12-26 ENCOUNTER — Encounter: Payer: Self-pay | Admitting: Internal Medicine

## 2021-12-26 NOTE — Progress Notes (Signed)
° °  Subjective:    Patient ID: [name], female    DOB: [birth year], 51 y.o.   MRN: [id number]  HPI 51 year old Female seen for evaluation of a mass in perirectal area. She noticed this just a couple of days ago. It is not tender. No bleeding.  She was seen in April for health maintenance exam.  She does not think area was present at that time.  She has a history of migraine headaches, mitral valve prolapse, history of bilateral carpal tunnel syndrome in 2005.  Review of Systems no issues with bowel movements.  These have remained regular.     Objective:   Physical Exam Blood pressure 104/68 pulse 64 temperature 97.5 degrees pulse oximetry 99% weight 136 pounds BMI 22.63  Perirectal area was examined.  There is a mild fullness.  The area patient is concerned about is not hard, red, has no drainage.     Assessment & Plan:  Concern for mass in perirectal area.  I feel that this is benign perhaps a cyst or other benign lesion.  Patient was reassured.

## 2021-12-26 NOTE — Patient Instructions (Signed)
It is my feeling this is a benign lesion and patient was reassured.

## 2022-01-25 NOTE — Progress Notes (Unsigned)
51 y.o. G46P2002 Married Caucasian female here for annual exam.  She is a new/old established patient.  PCP:  Cresenciano Lick. Baxley, MD   Patient's last menstrual period was 10/18/2016 (approximate).           Sexually active: {yes no:314532}  The current method of family planning is vasectomy.    Exercising: {yes no:314532}  {types:19826} Smoker:  no  Health Maintenance: Pap:  08-05-19 Neg:Neg HR HPV, 12-27-16 Neg:Neg HR HPV, 07/2015 normal History of abnormal Pap:  Yes, patient thinks she had an abnormal pap 2003 with colposcopy which was negative--no treatment to cervix. MMG:  03-28-21 Neg/BiRads1 Colonoscopy:  08-06-20 normal;next 10 years BMD:   ***  Result  *** TDaP:  03-17-21 Gardasil:   n/a HIV: *** Hep C: *** Screening Labs:  Hb today: ***, Urine today: ***   reports that she has never smoked. She has never used smokeless tobacco. She reports current alcohol use. She reports that she does not use drugs.  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      No current facility-administered medications for this visit.    Family History  Problem Relation Age of Onset   Asthma Mother    Cancer Father 50       prostate cancer   Hyperlipidemia Father     Prostate cancer Father    Aneurysm Sister        with brain aneurysm--doing well   Cerebral palsy Daughter    Breast cancer Maternal Aunt 68       A & W--BRCA neg   Diabetes Maternal Aunt    Stroke Paternal Grandmother    Aneurysm Paternal Aunt        dec brain aneurysm   Colon cancer Neg Hx    Colon polyps Neg Hx    Esophageal cancer Neg Hx    Rectal cancer Neg Hx    Stomach cancer Neg Hx     Review of Systems  Exam:   LMP 10/18/2016 (Approximate)     General appearance: alert, cooperative and appears stated age Head: normocephalic, without obvious abnormality, atraumatic Neck: no adenopathy, supple, symmetrical, trachea midline and thyroid normal to inspection and palpation Lungs: clear to auscultation bilaterally Breasts: normal appearance, no masses or tenderness, No nipple retraction or dimpling, No nipple discharge or bleeding, No axillary adenopathy Heart: regular rate and rhythm Abdomen: soft, non-tender; no masses, no organomegaly Extremities: extremities normal, atraumatic, no cyanosis or edema Skin: skin color, texture, turgor normal. No rashes or lesions Lymph nodes: cervical, supraclavicular, and axillary nodes  normal. Neurologic: grossly normal  Pelvic: External genitalia:  no lesions              No abnormal inguinal nodes palpated.              Urethra:  normal appearing urethra with no masses, tenderness or lesions              Bartholins and Skenes: normal                 Vagina: normal appearing vagina with normal color and discharge, no lesions              Cervix: no lesions              Pap taken: {yes no:314532} Bimanual Exam:  Uterus:  normal size, contour, position, consistency, mobility, non-tender              Adnexa: no mass, fullness, tenderness              Rectal exam: {yes no:314532}.  Confirms.              Anus:  normal sphincter tone, no lesions  Chaperone was present for exam:  ***  Assessment:   Well woman visit with gynecologic  exam.   Plan: Mammogram screening discussed. Self breast awareness reviewed. Pap and HR HPV as above. Guidelines for Calcium, Vitamin D, regular exercise program including cardiovascular and weight bearing exercise.   Follow up annually and prn.   Additional counseling given.  {yes [id number]. _______ minutes face to face time of which over 50% was spent in counseling.    After visit summary provided.

## 2022-01-26 ENCOUNTER — Encounter: Payer: BC Managed Care – PPO | Admitting: Obstetrics and Gynecology

## 2022-01-26 ENCOUNTER — Other Ambulatory Visit: Payer: Self-pay

## 2022-02-02 NOTE — Progress Notes (Signed)
GYNECOLOGY  VISIT ?  ?HPI: ?51 y.o.   Married  Caucasian  female   ?[id number] with Patient's last menstrual period was 10/18/2016 (approximate).   ?here for history of postmenopausal bleeding 05/2021. ?LMP 10/2016 prior to then. When patient mentioned this to PCP she was advised to make appointment with GYN. ? ?Her bleeding felt like a period in May or June, 2022.  ?No bleeding or spotting since then.  ?Nothing precipitating event.  ?No partner change.  ? ?No pelvic pain or discomfort.  ? ?Has some back pain.  ?Patient is lifting her daughter.  ? ?Working from home. ?2 daughters, both in college. ?One daughter has cerebral palsy. ? ?GYNECOLOGIC HISTORY: ?Patient's last menstrual period was 10/18/2016 (approximate). ?Contraception:  vasectomy/postmenopausal  ?Menopausal hormone therapy:  none ?Last mammogram:  03-28-21 Neg/BiRads1 ?Last pap smear:    08-05-19 Neg:Neg HR HPV, 12-27-16 Neg:Neg HR HPV, 07/2015 normal ?Hx abnormal pap in 2003 and normal colposcopy.  No treatment.  ?       ?OB History   ? ? Gravida  ?2  ? Para  ?2  ? Term  ?2  ? Preterm  ?   ? AB  ?   ? Living  ?2  ?  ? ? SAB  ?   ? IAB  ?   ? Ectopic  ?   ? Multiple  ?   ? Live Births  ?   ?   ?  ?  ?    ? ?Patient Active Problem List  ? Diagnosis Date Noted  ? Hypoglycemia   ? Impingement syndrome of right shoulder region 01/14/2019  ? Attention deficit 08/28/2011  ? Mitral valve prolapse 07/14/2011  ? Migraine headache 07/14/2011  ? Anxiety 07/14/2011  ? ? ?Past Medical History:  ?Diagnosis Date  ? Abnormal Pap smear of cervix 2003  ? neg colposcopy--no treatment to cervix  ? Allergy   ? Anemia   ? Anxiety   ? Bilateral carpal tunnel syndrome   ? Blood transfusion without reported diagnosis 2003  ? Hx placenta accreta  ? Cancer ([place]) 12/2016  ? Basal cell of nose  ? Endometriosis   ? Hypoglycemia   ? Infertility, female   ? Migraine   ? Mitral valve prolapse   ? Urinary incontinence   ? ? ?Past Surgical History:  ?Procedure Laterality Date  ? BARTHOLIN GLAND  CYST EXCISION    ? drained per pt  ? IVF    ? swdaton with egg retrieval   ? [place]  2000  ? PELVIC LAPAROSCOPY    ? WISDOM TOOTH EXTRACTION    ? ? ?Current Outpatient Medications  ?Medication Sig Dispense Refill  ? MELATONIN PO Take 5 mg by mouth at bedtime.     ? ?No current facility-administered medications for this visit.  ?  ? ?ALLERGIES: Sulfa antibiotics ? ?Family History  ?Problem Relation Age of Onset  ? Asthma Mother   ? Cancer Father 55  ?     prostate cancer  ? Hyperlipidemia Father   ? Prostate cancer Father   ? Aneurysm Sister   ?     with brain aneurysm--doing well  ? Cerebral palsy Daughter   ? Breast cancer Maternal Aunt 67  ?     A & W--BRCA neg  ? Diabetes Maternal Aunt   ? Stroke Paternal Grandmother   ? Aneurysm Paternal Aunt   ?     dec brain aneurysm  ? Colon cancer Neg Hx   ?  Colon polyps Neg Hx   ? Esophageal cancer Neg Hx   ? Rectal cancer Neg Hx   ? Stomach cancer Neg Hx   ? ? ?Social History  ? ?Socioeconomic History  ? Marital status: Married  ?  Spouse name: Not on file  ? Number of children: Not on file  ? Years of education: Not on file  ? Highest education level: Not on file  ?Occupational History  ? Not on file  ?Tobacco Use  ? Smoking status: Never  ? Smokeless tobacco: Never  ?Substance and Sexual Activity  ? Alcohol use: Yes  ?  Comment: occ. glass of wine  ? Drug use: No  ? Sexual activity: Yes  ?  Partners: Male  ?  Birth control/protection: Other-see comments  ?  Comment: partner with vasectomy  ?Other Topics Concern  ? Not on file  ?Social History Narrative  ? Not on file  ? ?Social Determinants of Health  ? ?Financial Resource Strain: Not on file  ?Food Insecurity: Not on file  ?Transportation Needs: Not on file  ?Physical Activity: Not on file  ?Stress: Not on file  ?Social Connections: Not on file  ?Intimate Partner Violence: Not on file  ? ? ?Review of Systems  ?Genitourinary:  Positive for vaginal bleeding (postmenopausal bleeding).  ?All  other systems reviewed and are negative. ? ?PHYSICAL EXAMINATION:   ? ?BP 110/68   Wt 134 lb (60.8 kg)   LMP 10/18/2016 (Approximate)   BMI 22.30 kg/m?     ?General appearance: alert, cooperative and appears stated age ?Abdomen: soft, non-tender, no masses,  no organomegaly ?No abnormal inguinal nodes palpated ? ?Pelvic: External genitalia:  no lesions ?             Urethra:  normal appearing urethra with no masses, tenderness or lesions ?             Bartholins and Skenes: normal    ?             Vagina: normal appearing vagina with normal color and discharge, no lesions ?             Cervix: no lesions ?               ?Bimanual Exam:  Uterus:  normal size, contour, position, consistency, mobility, non-tender ?             Adnexa: no mass, fullness, tenderness ?             Rectal exam: Yes.  .  Confirms. ?             Anus:  normal sphincter tone, no lesions ? ?Chaperone was present for exam:  Estill Bamberg, CMA ? ?ASSESSMENT ? ?Hx postmenopausal bleeding.  ?Remote history of abnormal pap.  ? ?PLAN ? ?We discussed postmenopausal bleeding and potential etiologies:  atrophy, polyps, ovarian cysts, cancer.  ?Return for pelvic ultrasound and follow visit.  ?Questions invited and answered. ?  ?An After Visit Summary was printed and given to the patient. ? ? ? ?

## 2022-02-06 ENCOUNTER — Encounter: Payer: Self-pay | Admitting: Obstetrics and Gynecology

## 2022-02-06 ENCOUNTER — Other Ambulatory Visit: Payer: Self-pay

## 2022-02-06 ENCOUNTER — Ambulatory Visit: Payer: BC Managed Care – PPO | Admitting: Obstetrics and Gynecology

## 2022-02-06 VITALS — BP 110/68 | Wt 134.0 lb

## 2022-02-06 DIAGNOSIS — Z8742 Personal history of other diseases of the female genital tract: Secondary | ICD-10-CM | POA: Diagnosis not present

## 2022-02-06 NOTE — Patient Instructions (Signed)
Postmenopausal Bleeding Postmenopausal bleeding is any bleeding that a woman has after she has entered menopause. Menopause is the end of a woman's fertile years. After menopause, a woman no longer ovulates and does not have menstrual periods. Therefore, she should no longer have bleeding from her vagina. Postmenopausal bleeding may have various causes, including: Menopausal hormone therapy (MHT). Endometrial atrophy. After menopause, low estrogen hormone levels cause the membrane that lines the uterus (endometrium) to become thin. You may have bleeding as the endometrium thins. Endometrial hyperplasia. This condition is caused by excess estrogen hormones and low levels of progesterone hormones. The excess estrogen causes the endometrium to thicken, which can lead to bleeding. In some cases, this can lead to cancer of the uterus. Endometrial cancer. Noncancerous growths (polyps) on the endometrium, the lining of the uterus, or the cervix. Uterine fibroids. These are noncancerous growths in or around the uterus muscle tissue that can cause heavy bleeding. Any type of postmenopausal bleeding, even if it appears to be a typical menstrual period, should be checked by your health care provider. Treatment will depend on the cause of the bleeding. Follow these instructions at home:  Pay attention to any changes in your symptoms. Let your health care provider know about them. Avoid using tampons and douches as told by your health care provider. Change your pads regularly. Get regular pelvic exams, including Pap tests, as told by your health care provider. Take iron supplements as told by your health care provider. Take over-the-counter and prescription medicines only as told by your health care provider. Keep all follow-up visits. This is important. Contact a health care provider if: You have new bleeding from the vagina after menopause. You have pain in your abdomen. Get help right away if: You have  a fever or chills. You have severe pain with bleeding. You are passing blood clots. You have heavy bleeding, need more than 1 pad an hour, and have never experienced this before. You have headaches or feel faint or dizzy. Summary Postmenopausal bleeding is any bleeding that a woman has after she has entered into menopause. Postmenopausal bleeding may have various causes. Treatment will depend on the cause of the bleeding. Any type of postmenopausal bleeding, even if it appears to be a typical menstrual period, should be checked by your health care provider. Be sure to pay attention to any changes in your symptoms and keep all follow-up visits. This information is not intended to replace advice given to you by your health care provider. Make sure you discuss any questions you have with your health care provider. Document Revised: 05/06/2020 Document Reviewed: 05/06/2020 Elsevier Patient Education  2022 Elsevier Inc.  

## 2022-03-01 DIAGNOSIS — L814 Other melanin hyperpigmentation: Secondary | ICD-10-CM | POA: Diagnosis not present

## 2022-03-01 DIAGNOSIS — D2271 Melanocytic nevi of right lower limb, including hip: Secondary | ICD-10-CM | POA: Diagnosis not present

## 2022-03-01 DIAGNOSIS — Z85828 Personal history of other malignant neoplasm of skin: Secondary | ICD-10-CM | POA: Diagnosis not present

## 2022-03-01 DIAGNOSIS — D2261 Melanocytic nevi of right upper limb, including shoulder: Secondary | ICD-10-CM | POA: Diagnosis not present

## 2022-03-08 NOTE — Progress Notes (Signed)
GYNECOLOGY  VISIT ?  ?HPI: ?51 y.o.   Married  Caucasian  female   ?[id number] with Patient's last menstrual period was 10/18/2016 (approximate).   ?here for  U/S check for postmenopausal bleeding in May or June, 2022.  ?Her prior LMP was 10/2016.  ? 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  ?Had a dilation and curettage due to post partum bleeding following vaginal delivery. ? ?GYNECOLOGIC HISTORY: ?Patient's last menstrual period was 10/18/2016 (approximate). ?Contraception:  PMP ?Menopausal hormone therapy:  None ?Last mammogram:  03-28-21 normal Bi Rad 1 ?Last pap smear:   08-05-19 Normal Neg HPV ?       ?OB History   ? ? Gravida  ?2  ? Para  ?2  ? Term  ?2  ? Preterm  ?   ? AB  ?   ? Living  ?2  ?  ? ? SAB  ?   ? IAB  ?   ? Ectopic  ?   ? Multiple  ?   ? Live Births  ?   ?   ?  ?  ?    ? ?Patient Active Problem List  ? Diagnosis Date Noted  ? Hypoglycemia   ? Impingement syndrome of right shoulder region 01/14/2019  ? Attention deficit 08/28/2011  ? Mitral valve prolapse 07/14/2011  ? Migraine headache 07/14/2011  ? Anxiety 07/14/2011  ? ? ?Past Medical History:  ?Diagnosis Date  ? Abnormal Pap smear of cervix 2003  ? neg colposcopy--no treatment to cervix  ? Allergy   ? Anemia   ? Anxiety   ? Bilateral carpal tunnel syndrome   ? Blood transfusion without reported diagnosis 2003  ? Hx placenta accreta  ? Cancer ([place]) 12/2016  ? Basal cell of nose  ? Endometriosis   ? Hypoglycemia   ? Infertility, female   ? Migraine   ? Mitral valve prolapse   ? Urinary incontinence   ? ? ?Past Surgical History:  ?Procedure Laterality Date  ? BARTHOLIN GLAND CYST EXCISION    ? drained per pt  ? IVF    ? swdaton with egg retrieval   ? [place]  2000  ? PELVIC LAPAROSCOPY    ? WISDOM TOOTH EXTRACTION    ? ? ?Current Outpatient Medications  ?Medication Sig Dispense Refill  ? MELATONIN PO Take 5 mg by mouth at bedtime.     ? ?No current facility-administered medications for  this visit.  ?  ? ?ALLERGIES: Sulfa antibiotics ? ?Family History  ?Problem Relation Age of Onset  ? Asthma Mother   ? Cancer Father 59  ?     prostate cancer  ? Hyperlipidemia Father   ? Prostate cancer Father   ? Aneurysm Sister   ?     with brain aneurysm--doing well  ? Cerebral palsy Daughter   ? Breast cancer Maternal Aunt 53  ?     A & W--BRCA neg  ? Diabetes Maternal Aunt   ? Stroke Paternal Grandmother   ? Aneurysm Paternal Aunt   ?     dec brain aneurysm  ? Colon cancer Neg Hx   ? Colon polyps Neg Hx   ? Esophageal cancer Neg Hx   ? Rectal cancer Neg Hx   ? Stomach cancer Neg Hx   ? ? ?Social History  ? ?Socioeconomic History  ? Marital status: Married  ?  Spouse name: Not on file  ? Number of children: Not on  file  ? Years of education: Not on file  ? Highest education level: Not on file  ?Occupational History  ? Not on file  ?Tobacco Use  ? Smoking status: Never  ? Smokeless tobacco: Never  ?Substance and Sexual Activity  ? Alcohol use: Yes  ?  Comment: occ. glass of wine  ? Drug use: No  ? Sexual activity: Yes  ?  Partners: Male  ?  Birth control/protection: Other-see comments  ?  Comment: partner with vasectomy  ?Other Topics Concern  ? Not on file  ?Social History Narrative  ? Not on file  ? ?Social Determinants of Health  ? ?Financial Resource Strain: Not on file  ?Food Insecurity: Not on file  ?Transportation Needs: Not on file  ?Physical Activity: Not on file  ?Stress: Not on file  ?Social Connections: Not on file  ?Intimate Partner Violence: Not on file  ? ? ?Review of Systems  ?All other systems reviewed and are negative. ? ?PHYSICAL EXAMINATION:   ? ?BP 124/76 (BP Location: Right Arm, Patient Position: Sitting, Cuff Size: Normal)   Pulse 70   Resp 12   Ht $R'5\' 5"'JR$  (1.651 m)   Wt 134 lb (60.8 kg)   LMP 10/18/2016 (Approximate)   BMI 22.30 kg/m?     ?General appearance: alert, cooperative and appears stated age ?  ?Pelvic US ?Uterus 4.9 x 3.77 x 2.85 cm.  ?EMS 2.17 mm, symmetrical at fundus.  In  the mid to lower uterine segment, a 1.5 x 0.6 cm cluster of calcifications are noted.  Unclear it this area is endometrial of myometrial.  No blood flow to the area.  ?Left ovary 2.13 x 0.89 x 1.06 cm, normal.  ?Right ovary 2.36 x 0.81 x 1.10 cm, normal. ?No adnexal masses.  ?No free fluid. ? ?ASSESSMENT ? ?History of postmenopausal bleeding.  ?Endometrial mass with calcifications.  ?Hx placenta accreta.  ? ?PLAN ? ?We discussed the ultrasound report and reviewed the images.  ?Patient will return for sonohyserogram and endometrial biopsy.  We discussed an alternative of hysteroscopy with dilation and curettage, but will proceed with office evaluation.  ?  ?An After Visit Summary was printed and given to the patient. ? ?23 min  total time was spent for this patient encounter, including preparation, face-to-face counseling with the patient, coordination of care, and documentation of the encounter. ? ? ? ?

## 2022-03-09 ENCOUNTER — Ambulatory Visit: Payer: BC Managed Care – PPO | Admitting: Obstetrics and Gynecology

## 2022-03-09 ENCOUNTER — Ambulatory Visit (INDEPENDENT_AMBULATORY_CARE_PROVIDER_SITE_OTHER): Payer: BC Managed Care – PPO

## 2022-03-09 ENCOUNTER — Encounter: Payer: Self-pay | Admitting: Obstetrics and Gynecology

## 2022-03-09 VITALS — BP 124/76 | HR 70 | Resp 12 | Ht 65.0 in | Wt 134.0 lb

## 2022-03-09 DIAGNOSIS — Z8742 Personal history of other diseases of the female genital tract: Secondary | ICD-10-CM

## 2022-03-09 DIAGNOSIS — N9489 Other specified conditions associated with female genital organs and menstrual cycle: Secondary | ICD-10-CM | POA: Diagnosis not present

## 2022-03-09 NOTE — Patient Instructions (Signed)
Endometrial Biopsy ?An endometrial biopsy is a procedure to remove tissue samples from the endometrium, which is the lining of the uterus. The tissue that is removed can then be checked under a microscope for disease. ?This procedure is used to diagnose conditions such as endometrial cancer, endometrial tuberculosis, polyps, or other inflammatory conditions. This procedure may also be used to investigate uterine bleeding to determine where you are in your menstrual cycle or how your hormone levels are affecting the lining of the uterus. ?Tell a health care provider about: ?Any allergies you have. ?All medicines you are taking, including vitamins, herbs, eye drops, creams, and over-the-counter medicines. ?Any problems you or family members have had with anesthetic medicines. ?Any blood disorders you have. ?Any surgeries you have had. ?Any medical conditions you have. ?Whether you are pregnant or may be pregnant. ?What are the risks? ?Generally, this is a safe procedure. However, problems may occur, including: ?Bleeding. ?Pelvic infection. ?Puncture of the wall of the uterus with the biopsy device (rare). ?Allergic reactions to medicines. ?What happens before the procedure? ?Keep a record of your menstrual cycles as told by your health care provider. You may need to schedule your procedure for a specific time in your cycle. ?You may want to bring a sanitary pad to wear after the procedure. ?Plan to have someone take you home from the hospital or clinic. ?Ask your health care provider about: ?Changing or stopping your regular medicines. This is especially important if you are taking diabetes medicines, arthritis medicines, or blood thinners. ?Taking medicines such as aspirin and ibuprofen. These medicines can thin your blood. Do not take these medicines unless your health care provider tells you to take them. ?Taking over-the-counter medicines, vitamins, herbs, and supplements. ?What happens during the procedure? ?You  will lie on an exam table with your feet and legs supported as in a pelvic exam. ?Your health care provider will insert an instrument (speculum) into your vagina to see your cervix. ?Your cervix will be cleansed with an antiseptic solution. ?A medicine (local anesthetic) will be used to numb the cervix. ?A forceps instrument (tenaculum) will be used to hold your cervix steady for the biopsy. ?A thin, rod-like instrument (uterine sound) will be inserted through your cervix to determine the length of your uterus and the location where the biopsy sample will be removed. ?A thin, flexible tube (catheter) will be inserted through your cervix and into the uterus. The catheter will be used to collect the biopsy sample from your endometrial tissue. ?The catheter and speculum will then be removed, and the tissue sample will be sent to a lab for examination. ?The procedure may vary among health care providers and hospitals. ?What can I expect after procedure? ?You will rest in a recovery area until you are ready to go home. ?You may have mild cramping and a small amount of vaginal bleeding. This is normal. ?You may have a small amount of vaginal bleeding for a few days. This is normal. ?It is up to you to get the results of your procedure. Ask your health care provider, or the department that is doing the procedure, when your results will be ready. ?Follow these instructions at home: ?Take over-the-counter and prescription medicines only as told by your health care provider. ?Do not douche, use tampons, or have sexual intercourse until your health care provider approves. ?Return to your normal activities as told by your health care provider. Ask your health care provider what activities are safe for you. ?Follow instructions   from your health care provider about any activity restrictions, such as restrictions on strenuous exercise or heavy lifting. ?Keep all follow-up visits. This is important. ?Contact a health care  provider: ?You have heavy bleeding, or bleed for longer than 2 days after the procedure. ?You have bad smelling discharge from your vagina. ?You have a fever or chills. ?You have a burning sensation when urinating or you have difficulty urinating. ?You have severe pain in your lower abdomen. ?Get help right away if you: ?You have severe cramps in your stomach or back. ?You pass large blood clots. ?Your bleeding increases. ?You become weak or light-headed, or you faint or lose consciousness. ?Summary ?An endometrial biopsy is a procedure to remove tissue samples is taken from the endometrium, which is the lining of the uterus. ?The tissue sample that is removed will be checked under a microscope for disease. ?This procedure is used to diagnose conditions such as endometrial cancer, endometrial tuberculosis, polyps, or other inflammatory conditions. ?After the procedure, it is common to have mild cramping and a small amount of vaginal bleeding for a few days. ?Do not douche, use tampons, or have sexual intercourse until your health care provider approves. Ask your health care provider which activities are safe for you. ?This information is not intended to replace advice given to you by your health care provider. Make sure you discuss any questions you have with your health care provider. ?Document Revised: 08/03/2021 Document Reviewed: 06/14/2020 ?Elsevier Patient Education ? 2022 [employer]. ? ? ?Sonohysterogram ?A sonohysterogram is a procedure to look at the inside of the uterus. This procedure uses sound waves that are sent to a computer to make images of the lining of the uterus (endometrium). To get the best images, a germ-free (sterile) saline solution is put into the uterus through the vagina. This solution is made of salt and water. ?You may have this procedure if you have certain reproductive problems, such as: ?Abnormal uterine bleeding. ?Infertility. ?Repeated miscarriage. ?This procedure can show what  may be causing these problems. Possible causes include scarring or abnormal growths, such as fibroids or polyps, inside your uterus. It can also show if your uterus is an abnormal shape, or if there are any problems with the lining of your uterus. ?Tell a health care provider about: ?All medicines you are taking, including vitamins, herbs, eye drops, creams, and over-the-counter medicines. ?Any allergies you have. ?The date of the first day of your last menstrual period or whether you are pregnant or may be pregnant. The procedure will not be done if you are pregnant. ?Any signs of infection, such as fever, pain in your lower abdomen, or abnormal discharge from your vagina. The procedure will not be done if you have an infection. ?Any medical conditions you have. ?Any surgeries you have had. ?Any bleeding problems you have. ?What are the risks? ?Generally, this is a safe procedure. However, problems may occur, including: ?Pain or fever a day or two after the procedure. ?Increased vaginal discharge. ?Infection. ?What happens before the procedure? ?You may be asked to take a pregnancy test. This is usually in the form of a urine test. ?You may be given medicine to stop any abnormal bleeding. ?Your health care provider may have you take an over-the-counter pain medicine. ?You will be asked to empty your bladder. ?You will be asked to undress from the waist down. ?You will lie down on the exam table with your feet in stirrups or with your knees bent and your feet  flat on the table. ?You may have a pelvic exam. ?What happens during the procedure? ?You will have a transvaginal ultrasound. This is a test that uses sound waves to take pictures of the female genital tract. The pictures are taken with a device, called a transducer, that is placed in the vagina. For this test: ?The transducer will have a slippery substance put on it and will be placed into your vagina. ?The transducer will be positioned to send sound waves to  your uterus. The sound waves will be sent to a computer and turned into images, which your health care provider will see during the procedure. ?The transducer will be removed from your vagina. ?Fluid will be pu

## 2022-03-10 ENCOUNTER — Other Ambulatory Visit: Payer: Self-pay | Admitting: Internal Medicine

## 2022-03-10 DIAGNOSIS — Z1231 Encounter for screening mammogram for malignant neoplasm of breast: Secondary | ICD-10-CM

## 2022-03-29 ENCOUNTER — Ambulatory Visit: Payer: BC Managed Care – PPO

## 2022-04-06 ENCOUNTER — Ambulatory Visit: Payer: BC Managed Care – PPO

## 2022-04-18 ENCOUNTER — Encounter: Payer: Self-pay | Admitting: Obstetrics and Gynecology

## 2022-04-18 ENCOUNTER — Other Ambulatory Visit: Payer: Self-pay | Admitting: Obstetrics and Gynecology

## 2022-04-18 ENCOUNTER — Ambulatory Visit (INDEPENDENT_AMBULATORY_CARE_PROVIDER_SITE_OTHER): Payer: BC Managed Care – PPO | Admitting: Obstetrics and Gynecology

## 2022-04-18 ENCOUNTER — Ambulatory Visit (INDEPENDENT_AMBULATORY_CARE_PROVIDER_SITE_OTHER): Payer: BC Managed Care – PPO

## 2022-04-18 ENCOUNTER — Ambulatory Visit
Admission: RE | Admit: 2022-04-18 | Discharge: 2022-04-18 | Disposition: A | Discharge status: Home / Self Care | Payer: BC Managed Care – PPO | Source: Ambulatory Visit | Attending: Internal Medicine | Admitting: Internal Medicine

## 2022-04-18 ENCOUNTER — Other Ambulatory Visit: Payer: BC Managed Care – PPO

## 2022-04-18 VITALS — BP 100/66 | HR 62 | Resp 12 | Ht 65.0 in | Wt 139.0 lb

## 2022-04-18 DIAGNOSIS — N9489 Other specified conditions associated with female genital organs and menstrual cycle: Secondary | ICD-10-CM | POA: Diagnosis not present

## 2022-04-18 DIAGNOSIS — Z1231 Encounter for screening mammogram for malignant neoplasm of breast: Secondary | ICD-10-CM

## 2022-04-18 DIAGNOSIS — Z8742 Personal history of other diseases of the female genital tract: Secondary | ICD-10-CM | POA: Diagnosis not present

## 2022-04-18 DIAGNOSIS — R9389 Abnormal findings on diagnostic imaging of other specified body structures: Secondary | ICD-10-CM

## 2022-04-18 NOTE — Progress Notes (Signed)
GYNECOLOGY  VISIT   HPI: 51 y.o.   Married  Caucasian  female   [id number] with Patient's last menstrual period was 10/18/2016 (approximate).   here for sonohysterogram for postmenopausal bleeding 05/2021.   Prior LMP was 2017.   Going to [place] 05/24/22.   GYNECOLOGIC HISTORY: Patient's last menstrual period was 10/18/2016 (approximate). Contraception:  vasectomy/PMP Menopausal hormone therapy:  none Last mammogram:  04-18-22 results not yet available  Last pap smear:   08-05-2019 negative, HR HPV negative         OB History     Gravida  2   Para  2   Term  2   Preterm      AB      Living  2      SAB      IAB      Ectopic      Multiple      Live Births                 Patient Active Problem List   Diagnosis Date Noted   Hypoglycemia    Impingement syndrome of right shoulder region 01/14/2019   Attention deficit 08/28/2011   Mitral valve prolapse 07/14/2011   Migraine headache 07/14/2011   Anxiety 07/14/2011    Past Medical History:  Diagnosis Date   Abnormal Pap smear of cervix 2003   neg colposcopy--no treatment to cervix   Allergy    Anemia    Anxiety    Bilateral carpal tunnel syndrome    Blood transfusion without reported diagnosis 2003   Hx placenta accreta   Cancer ([place]) 12/2016   Basal cell of nose   Endometriosis    Hypoglycemia    Infertility, female    Migraine    Mitral valve prolapse    Urinary incontinence     Past Surgical History:  Procedure Laterality Date   BARTHOLIN GLAND CYST EXCISION     drained per pt   IVF     swdaton with egg retrieval    LAPAROSCOPIC ENDOMETRIOSIS FULGURATION  2000   PELVIC LAPAROSCOPY     WISDOM TOOTH EXTRACTION      Current Outpatient Medications  Medication Sig Dispense Refill   MELATONIN PO Take 5 mg by mouth at bedtime.      No current facility-administered medications for this visit.     ALLERGIES: Sulfa antibiotics  Family History  Problem Relation Age of Onset   Asthma  Mother    Cancer Father 21       prostate cancer   Hyperlipidemia Father    Prostate cancer Father    Aneurysm Sister        with brain aneurysm--doing well   Cerebral palsy Daughter    Breast cancer Maternal Aunt 68       A & W--BRCA neg   Diabetes Maternal Aunt    Stroke Paternal Grandmother    Aneurysm Paternal Aunt        dec brain aneurysm   Colon cancer Neg Hx    Colon polyps Neg Hx    Esophageal cancer Neg Hx    Rectal cancer Neg Hx    Stomach cancer Neg Hx     Social History   Socioeconomic History   Marital status: Married    Spouse name: Not on file   Number of children: Not on file   Years of education: Not on file   Highest education level: Not on file  Occupational History   Not  on file  Tobacco Use   Smoking status: Never   Smokeless tobacco: Never  Substance and Sexual Activity   Alcohol use: Yes    Comment: occ. glass of wine   Drug use: No   Sexual activity: Yes    Partners: Male    Birth control/protection: Other-see comments    Comment: partner with vasectomy  Other Topics Concern   Not on file  Social History Narrative   Not on file   Social Determinants of Health   Financial Resource Strain: Not on file  Food Insecurity: Not on file  Transportation Needs: Not on file  Physical Activity: Not on file  Stress: Not on file  Social Connections: Not on file  Intimate Partner Violence: Not on file    Review of Systems  All other systems reviewed and are negative.  PHYSICAL EXAMINATION:    BP 100/66 (BP Location: Right Arm, Patient Position: Sitting, Cuff Size: Normal)   Pulse 62   Resp 12   Ht $R'5\' 5"'Lv$  (1.651 m)   Wt 139 lb (63 kg)   LMP 10/18/2016 (Approximate)   BMI 23.13 kg/m     General appearance: alert, cooperative and appears stated age Lungs:  CTA bilaterally.  Cor:  S1S2 RRR.   Pelvic US: Thin symmetrical endometrium at the fundus, 1.4 mm.  Echogenic cluster of calcifications in the LUS.   Sonohysterogram:  attempted  but not tolerated, so procedure was abandoned. Sterile prep of cervix. Cannula placed.  Patient developed sweating and feeling faint and poorly.  Procedure abandoned.  She did not loose consciousness.   Chaperone was present for exam:  Caryl Pina, [place] tech  ASSESSMENT  Postmenopausal bleeding.  Abnormal ultrasound finding of echogenic cluster of calcifications in the LUS.  Hx placenta accreta.   PLAN  I am recommending hysteroscopy with possible Myosure resection of endometrium, dilation and curettage.  Risks, benefits, and alternatives reviewed. Risks include but are not limited to bleeding, infection, damage to surrounding organs including uterine perforation requiring hospitalization and laparoscopy, pulmonary edema, reaction to anesthesia, DVT, PE, death, need for further treatment. Surgical expectations and recovery discussed.  Patient wishes to proceed. ACOG HO on hysteroscopy and dilation and curettage.  An After Visit Summary was printed and given to the patient.

## 2022-04-20 ENCOUNTER — Telehealth: Payer: Self-pay | Admitting: Obstetrics and Gynecology

## 2022-04-20 DIAGNOSIS — R9389 Abnormal findings on diagnostic imaging of other specified body structures: Secondary | ICD-10-CM

## 2022-04-20 DIAGNOSIS — Z8742 Personal history of other diseases of the female genital tract: Secondary | ICD-10-CM

## 2022-04-20 NOTE — Telephone Encounter (Signed)
Please precert and schedule hysteroscopy with possible Myosure resection of endometrium, dilation and curettage at [facility].  Patient has a history of postmenopausal bleeding and an abnormal echogenic area of calcification in her lower uterine segment.   Time needed 45 minutes.   No preop needed.

## 2022-04-21 NOTE — Telephone Encounter (Signed)
Spoke with patient. Patient is requesting to proceed with surgery ASAP, she will be traveling out of the country 6[place]21 [place]3. Reviewed available dates, requesting to proceed on 05/08/22.  Advised patient I will forward to business office for return call. I will return call once surgery date and time confirmed. Patient verbalizes understanding and is agreeable.   Surgery request sent.

## 2022-04-21 NOTE — Telephone Encounter (Signed)
Spoke with patient. Surgery date request confirmed.  Advised surgery is scheduled for [facility] on 05/15/22 at [facility].  Surgery instruction sheet and hospital brochure reviewed, printed copy will be mailed.  Patient verbalizes understanding and is agreeable.   Encounter may be closed after benefits reviewed.

## 2022-04-23 ENCOUNTER — Telehealth: Payer: Self-pay | Admitting: Obstetrics and Gynecology

## 2022-04-23 NOTE — Telephone Encounter (Signed)
Please contact patient to schedule a pap with me.  I reviewed her chart in preparation for her upcoming surgery, and she needs an appointment to update her pap.

## 2022-04-24 NOTE — Telephone Encounter (Signed)
I do recommend the pap and high risk HPV testing after reviewing her chart further in preparation for her surgery.  This is based on her history of postmenopausal bleeding and the abnormal finding in her lower segment of the uterus.  I think that doing cervical cancer screening prior to her surgery will result in best care for her.   Thank you!

## 2022-04-24 NOTE — Telephone Encounter (Signed)
Pt notified and voiced understanding. Pt just wanted me to confirm with you that she did have a pap with her PCP in 08/2019. She also stated that she remembered asking you about it at her visit in March but she was told one was not needed. Either way she said she would be happy to come in if need be. Please advise. Thanks.

## 2022-04-24 NOTE — Telephone Encounter (Signed)
Left detailed VM per DPR. Will send msg to scheduling to call and set up.

## 2022-04-24 NOTE — Telephone Encounter (Signed)
Spoke with patient regarding surgery benefits. Patient acknowledges understanding of information presented. Patient is aware that benefits presented are professional benefits only. Patient is aware the hospital will call with facility benefits. See account note.  Encounter closed.

## 2022-04-25 NOTE — Progress Notes (Unsigned)
GYNECOLOGY  VISIT   HPI: 51 y.o.   Married  Caucasian  female   G2P2002 with Patient's last menstrual period was 10/18/2016 (approximate).   here for pre op exam and pap smear.  She has a history of postmenopausal bleeding. Her ultrasound showed a calcified area in the lower uterine segment.  She has a history of placenta accreta.  The experience of this diagnosis was very difficult.   Patient is a mental health therapist.   Traveling to [place] in June.   GYNECOLOGIC HISTORY: Patient's last menstrual period was 10/18/2016 (approximate). Contraception:  Vasectomy/PMP Menopausal hormone therapy:  none Last mammogram:  04-18-22 Neg/Birads1 Last pap smear:  08-05-19 Neg:Neg HR HPV, 12-27-16 Neg:Neg HR HPV        OB History     Gravida  2   Para  2   Term  2   Preterm      AB      Living  2      SAB      IAB      Ectopic      Multiple      Live Births                 Patient Active Problem List   Diagnosis Date Noted   Hypoglycemia    Impingement syndrome of right shoulder region 01/14/2019   Attention deficit 08/28/2011   Mitral valve prolapse 07/14/2011   Migraine headache 07/14/2011   Anxiety 07/14/2011    Past Medical History:  Diagnosis Date   Abnormal Pap smear of cervix 2003   neg colposcopy--no treatment to cervix   Allergy    Anemia    Anxiety    Bilateral carpal tunnel syndrome    Blood transfusion without reported diagnosis 2003   Hx placenta accreta   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   Endometriosis    Hypoglycemia    Infertility, female    Migraine    Mitral valve prolapse    Urinary incontinence     Past Surgical History:  Procedure Laterality Date   BARTHOLIN GLAND CYST EXCISION     drained per pt   IVF     swdaton with egg retrieval    LAPAROSCOPIC ENDOMETRIOSIS FULGURATION  2000   PELVIC LAPAROSCOPY     WISDOM TOOTH EXTRACTION      Current Outpatient Medications  Medication Sig Dispense Refill    MELATONIN PO Take 5 mg by mouth at bedtime.      No current facility-administered medications for this visit.     ALLERGIES: Sulfa antibiotics  Family History  Problem Relation Age of Onset   Asthma Mother    Cancer Father 30       prostate cancer   Hyperlipidemia Father    Prostate cancer Father    Aneurysm Sister        with brain aneurysm--doing well   Cerebral palsy Daughter    Breast cancer Maternal Aunt 68       A & W--BRCA neg   Diabetes Maternal Aunt    Stroke Paternal Grandmother    Aneurysm Paternal Aunt        dec brain aneurysm   Colon cancer Neg Hx    Colon polyps Neg Hx    Esophageal cancer Neg Hx    Rectal cancer Neg Hx    Stomach cancer Neg Hx     Social History   Socioeconomic History   Marital status: Married  Spouse name: Not on file   Number of children: Not on file   Years of education: Not on file   Highest education level: Not on file  Occupational History   Not on file  Tobacco Use   Smoking status: Never   Smokeless tobacco: Never  Substance and Sexual Activity   Alcohol use: Yes    Comment: occ. glass of wine   Drug use: No   Sexual activity: Yes    Partners: Male    Birth control/protection: Other-see comments    Comment: partner with vasectomy  Other Topics Concern   Not on file  Social History Narrative   Not on file   Social Determinants of Health   Financial Resource Strain: Not on file  Food Insecurity: Not on file  Transportation Needs: Not on file  Physical Activity: Not on file  Stress: Not on file  Social Connections: Not on file  Intimate Partner Violence: Not on file    Review of Systems  All other systems reviewed and are negative.  PHYSICAL EXAMINATION:    BP 100/62   Pulse 74   Ht $R'5\' 5"'Sm$  (1.651 m)   Wt 139 lb (63 kg)   LMP 10/18/2016 (Approximate)   SpO2 98%   BMI 23.13 kg/m     General appearance: alert, cooperative and appears stated age Head: Normocephalic, without obvious abnormality,  atraumatic Lungs: clear to auscultation bilaterally Heart: regular rate and rhythm Abdomen: soft, non-tender, no masses,  no organomegaly No abnormal inguinal nodes palpated Neurologic: Grossly normal  Pelvic: External genitalia:  no lesions              Urethra:  normal appearing urethra with no masses, tenderness or lesions              Bartholins and Skenes: normal                 Vagina: normal appearing vagina with normal color and discharge, no lesions              Cervix: no lesions                Bimanual Exam:  Uterus:  normal size, contour, position, consistency, mobility, non-tender              Adnexa: no mass, fullness, tenderness         Chaperone was present for exam:  Santiago Glad, CMA  ASSESSMENT  History of postmenopausal bleeding.  Cervical cancer screening.  Abnormal ultrasound finding.  I think that the diagnosis of cancer is not the most likely diagnosis.  This may be scarring from her prior placenta accreta.  PLAN  Pap and HR HPV collected.  Will proceed with hysteroscopy, possible Myosure resection of a uterine mass, dilation and curettage.    An After Visit Summary was printed and given to the patient.  19 min  total time was spent for this patient encounter, including preparation, face-to-face counseling with the patient, coordination of care, and documentation of the encounter.

## 2022-04-25 NOTE — Telephone Encounter (Signed)
Scheduled for 04/26/22.

## 2022-04-26 ENCOUNTER — Encounter: Payer: Self-pay | Admitting: Obstetrics and Gynecology

## 2022-04-26 ENCOUNTER — Ambulatory Visit: Payer: BC Managed Care – PPO | Admitting: Obstetrics and Gynecology

## 2022-04-26 ENCOUNTER — Other Ambulatory Visit (HOSPITAL_COMMUNITY)
Admission: RE | Admit: 2022-04-26 | Discharge: 2022-04-26 | Disposition: A | Discharge status: Home / Self Care | Payer: BC Managed Care – PPO | Source: Ambulatory Visit | Attending: Obstetrics and Gynecology | Admitting: Obstetrics and Gynecology

## 2022-04-26 VITALS — BP 100/62 | HR 74 | Ht 65.0 in | Wt 139.0 lb

## 2022-04-26 DIAGNOSIS — Z124 Encounter for screening for malignant neoplasm of cervix: Secondary | ICD-10-CM | POA: Diagnosis not present

## 2022-04-26 DIAGNOSIS — Z8742 Personal history of other diseases of the female genital tract: Secondary | ICD-10-CM | POA: Diagnosis not present

## 2022-04-30 NOTE — H&P (Signed)
Sandra Salles, MD Physician Gynecology Progress Notes    Signed Encounter Date:  04/26/2022  Related encounter: Office Visit from 04/26/2022 in [facility]   Signed      GYNECOLOGY  VISIT   HPI: 51 y.o.   Married  Caucasian  female   G2P2002 with Patient's last menstrual period was 10/18/2016 (approximate).   here for pre op exam and pap smear.   She has a history of postmenopausal bleeding. Her ultrasound showed a calcified area in the lower uterine segment.   She has a history of placenta accreta.  The experience of this diagnosis was very difficult.    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    GYNECOLOGIC HISTORY: Patient's last menstrual period was 10/18/2016 (approximate). Contraception:  Vasectomy/PMP Menopausal hormone therapy:  none Last mammogram:  04-18-22 Neg/Birads1 Last pap smear:  08-05-19 Neg:Neg HR HPV, 12-27-16 Neg:Neg HR HPV        OB History       Gravida  2   Para  2   Term  2   Preterm      AB      Living  2        SAB      IAB      Ectopic      Multiple      Live Births                        Patient Active Problem List    Diagnosis Date Noted   Hypoglycemia     Impingement syndrome of right shoulder region 01/14/2019   Attention deficit 08/28/2011   Mitral valve prolapse 07/14/2011   Migraine headache 07/14/2011   Anxiety 07/14/2011          Past Medical History:  Diagnosis Date   Abnormal Pap smear of cervix 2003    neg colposcopy--no treatment to cervix   Allergy     Anemia     Anxiety     Bilateral carpal tunnel syndrome     Blood transfusion without reported diagnosis 2003    Hx placenta accreta   Cancer (HCC) 12/2016    Basal cell of nose   Endometriosis     Hypoglycemia     Infertility, female     Migraine     Mitral valve prolapse     Urinary incontinence             Past Surgical History:  Procedure Laterality Date   BARTHOLIN  GLAND CYST EXCISION        drained per pt   IVF        swdaton with egg retrieval    LAPAROSCOPIC ENDOMETRIOSIS FULGURATION   2000   PELVIC LAPAROSCOPY       WISDOM TOOTH EXTRACTION                Current Outpatient Medications  Medication Sig Dispense Refill   MELATONIN PO Take 5 mg by mouth at bedtime.         No current facility-administered medications for this visit.      ALLERGIES: Sulfa antibiotics        Family History  Problem Relation Age of Onset   Asthma Mother     Cancer Father 25        prostate cancer   Hyperlipidemia Father     Prostate cancer Father  Aneurysm Sister          with brain aneurysm--doing well   Cerebral palsy Daughter     Breast cancer Maternal Aunt 68        A & W--BRCA neg   Diabetes Maternal Aunt     Stroke Paternal Grandmother     Aneurysm Paternal Aunt          dec brain aneurysm   Colon cancer Neg Hx     Colon polyps Neg Hx     Esophageal cancer Neg Hx     Rectal cancer Neg Hx     Stomach cancer Neg Hx        Social History         Socioeconomic History   Marital status: Married      Spouse name: Not on file   Number of children: Not on file   Years of education: Not on file   Highest education level: Not on file  Occupational History   Not on file  Tobacco Use   Smoking status: Never   Smokeless tobacco: Never  Substance and Sexual Activity   Alcohol use: Yes      Comment: occ. glass of wine   Drug use: No   Sexual activity: Yes      Partners: Male      Birth control/protection: Other-see comments      Comment: partner with vasectomy  Other Topics Concern   Not on file  Social History Narrative   Not on file    Social Determinants of Health    Financial Resource Strain: Not on file  Food Insecurity: Not on file  Transportation Needs: Not on file  Physical Activity: Not on file  Stress: Not on file  Social Connections: Not on file  Intimate Partner Violence: Not on file      Review of Systems   All other systems reviewed and are negative.   PHYSICAL EXAMINATION:     BP 100/62   Pulse 74   Ht $R'5\' 5"'dC$  (1.651 m)   Wt 139 lb (63 kg)   LMP 10/18/2016 (Approximate)   SpO2 98%   BMI 23.13 kg/m     General appearance: alert, cooperative and appears stated age Head: Normocephalic, without obvious abnormality, atraumatic Lungs: clear to auscultation bilaterally Heart: regular rate and rhythm Abdomen: soft, non-tender, no masses,  no organomegaly No abnormal inguinal nodes palpated Neurologic: Grossly normal   Pelvic: External genitalia:  no lesions              Urethra:  normal appearing urethra with no masses, tenderness or lesions              Bartholins and Skenes: normal                 Vagina: normal appearing vagina with normal color and discharge, no lesions              Cervix: no lesions                Bimanual Exam:  Uterus:  normal size, contour, position, consistency, mobility, non-tender              Adnexa: no mass, fullness, tenderness          Chaperone was present for exam:  Santiago Glad, CMA   ASSESSMENT   History of postmenopausal bleeding.  Cervical cancer screening.  Abnormal ultrasound finding.  I think that the diagnosis of cancer is not the most likely  diagnosis.  This may be scarring from her prior placenta accreta.   PLAN   Pap and HR HPV collected.  Will proceed with hysteroscopy, possible Myosure resection of a uterine mass, dilation and curettage.    An After Visit Summary was printed and given to the patient.   19 min  total time was spent for this patient encounter, including preparation, face-to-face counseling with the patient, coordination of care, and documentation of the encounter.

## 2022-05-02 LAB — CYTOLOGY - PAP
Comment: NEGATIVE
Diagnosis: NEGATIVE
High risk HPV: NEGATIVE

## 2022-05-05 ENCOUNTER — Encounter (HOSPITAL_BASED_OUTPATIENT_CLINIC_OR_DEPARTMENT_OTHER): Payer: Self-pay | Admitting: Obstetrics and Gynecology

## 2022-05-05 DIAGNOSIS — N950 Postmenopausal bleeding: Secondary | ICD-10-CM

## 2022-05-05 HISTORY — DX: Postmenopausal bleeding: N95.0

## 2022-05-11 ENCOUNTER — Encounter (HOSPITAL_COMMUNITY)
Admission: RE | Admit: 2022-05-11 | Discharge: 2022-05-11 | Disposition: A | Discharge status: Home / Self Care | Payer: BC Managed Care – PPO | Source: Ambulatory Visit | Attending: Obstetrics and Gynecology | Admitting: Obstetrics and Gynecology

## 2022-05-11 DIAGNOSIS — R9389 Abnormal findings on diagnostic imaging of other specified body structures: Secondary | ICD-10-CM | POA: Insufficient documentation

## 2022-05-11 DIAGNOSIS — Z8742 Personal history of other diseases of the female genital tract: Secondary | ICD-10-CM | POA: Insufficient documentation

## 2022-05-11 DIAGNOSIS — Z01812 Encounter for preprocedural laboratory examination: Secondary | ICD-10-CM | POA: Insufficient documentation

## 2022-05-11 LAB — BASIC METABOLIC PANEL
Anion gap: 7 (ref 5–15)
BUN: 15 mg/dL (ref 6–20)
CO2: 25 mmol/L (ref 22–32)
Calcium: 9.2 mg/dL (ref 8.9–10.3)
Chloride: 106 mmol/L (ref 98–111)
Creatinine, Ser: 0.66 mg/dL (ref 0.44–1.00)
GFR, Estimated: >60 mL/min (ref >60)
Glucose, Bld: 93 mg/dL (ref 70–99)
Potassium: 4 mmol/L (ref 3.5–5.1)
Sodium: 138 mmol/L (ref 135–145)

## 2022-05-11 LAB — CBC
HCT: 43.5 % (ref 36.0–46.0)
Hemoglobin: 14.2 g/dL (ref 12.0–15.0)
MCH: 30.3 pg (ref 26.0–34.0)
MCHC: 32.6 g/dL (ref 30.0–36.0)
MCV: 92.9 fL (ref 80.0–100.0)
Platelets: 186 10*3/uL (ref 150–400)
RBC: 4.68 MIL/uL (ref 3.87–5.11)
RDW: 12.4 % (ref 11.5–15.5)
WBC: 7.9 10*3/uL (ref 4.0–10.5)
nRBC: 0 % (ref 0.0–0.2)

## 2022-05-15 ENCOUNTER — Ambulatory Visit (HOSPITAL_BASED_OUTPATIENT_CLINIC_OR_DEPARTMENT_OTHER): Payer: BC Managed Care – PPO | Admitting: Anesthesiology

## 2022-05-15 ENCOUNTER — Encounter (HOSPITAL_BASED_OUTPATIENT_CLINIC_OR_DEPARTMENT_OTHER)
Admission: RE | Disposition: A | Discharge status: Home / Self Care | Payer: Self-pay | Source: Home / Self Care | Attending: Obstetrics and Gynecology

## 2022-05-15 ENCOUNTER — Other Ambulatory Visit: Payer: Self-pay

## 2022-05-15 ENCOUNTER — Encounter (HOSPITAL_BASED_OUTPATIENT_CLINIC_OR_DEPARTMENT_OTHER): Payer: Self-pay | Admitting: Obstetrics and Gynecology

## 2022-05-15 ENCOUNTER — Ambulatory Visit (HOSPITAL_BASED_OUTPATIENT_CLINIC_OR_DEPARTMENT_OTHER)
Admission: RE | Admit: 2022-05-15 | Discharge: 2022-05-15 | Disposition: A | Discharge status: Home / Self Care | Payer: BC Managed Care – PPO | Attending: Obstetrics and Gynecology | Admitting: Obstetrics and Gynecology

## 2022-05-15 DIAGNOSIS — N950 Postmenopausal bleeding: Secondary | ICD-10-CM | POA: Insufficient documentation

## 2022-05-15 DIAGNOSIS — N840 Polyp of corpus uteri: Secondary | ICD-10-CM | POA: Insufficient documentation

## 2022-05-15 DIAGNOSIS — R9389 Abnormal findings on diagnostic imaging of other specified body structures: Secondary | ICD-10-CM

## 2022-05-15 DIAGNOSIS — Z01818 Encounter for other preprocedural examination: Secondary | ICD-10-CM

## 2022-05-15 DIAGNOSIS — Z8742 Personal history of other diseases of the female genital tract: Secondary | ICD-10-CM | POA: Diagnosis not present

## 2022-05-15 DIAGNOSIS — Q51818 Other congenital malformations of uterus: Secondary | ICD-10-CM | POA: Diagnosis not present

## 2022-05-15 DIAGNOSIS — N736 Female pelvic peritoneal adhesions (postinfective): Secondary | ICD-10-CM | POA: Diagnosis not present

## 2022-05-15 HISTORY — PX: DILATATION & CURETTAGE/HYSTEROSCOPY WITH MYOSURE: SHX6511

## 2022-05-15 SURGERY — DILATATION & CURETTAGE/HYSTEROSCOPY WITH MYOSURE
Anesthesia: General

## 2022-05-15 MED ORDER — OXYCODONE HCL 5 MG PO TABS: 5.0000 mg | ORAL_TABLET | Freq: Once | ORAL | Status: DC | PRN

## 2022-05-15 MED ORDER — SODIUM CHLORIDE 0.9 % IR SOLN
Status: DC | PRN
  Administered 2022-05-15: 3000 mL

## 2022-05-15 MED ORDER — LIDOCAINE HCL 1 % IJ SOLN
INTRAMUSCULAR | Status: DC | PRN
  Administered 2022-05-15: 10 mL

## 2022-05-15 MED ORDER — POVIDONE-IODINE 10 % EX SWAB: 2.0000 "application " | Freq: Once | CUTANEOUS | Status: DC

## 2022-05-15 MED ORDER — DEXAMETHASONE SODIUM PHOSPHATE 4 MG/ML IJ SOLN
INTRAMUSCULAR | Status: DC | PRN
  Administered 2022-05-15: 10 mg via INTRAVENOUS

## 2022-05-15 MED ORDER — ONDANSETRON HCL 4 MG/2ML IJ SOLN
INTRAMUSCULAR | Status: AC
  Filled 2022-05-15: qty 2

## 2022-05-15 MED ORDER — FENTANYL CITRATE (PF) 100 MCG/2ML IJ SOLN
INTRAMUSCULAR | Status: AC
  Filled 2022-05-15: qty 2

## 2022-05-15 MED ORDER — MIDAZOLAM HCL 5 MG/5ML IJ SOLN
INTRAMUSCULAR | Status: DC | PRN
  Administered 2022-05-15: 2 mg via INTRAVENOUS

## 2022-05-15 MED ORDER — LIDOCAINE HCL (PF) 2 % IJ SOLN
INTRAMUSCULAR | Status: AC
  Filled 2022-05-15: qty 5

## 2022-05-15 MED ORDER — IBUPROFEN 800 MG PO TABS: 800.0000 mg | ORAL_TABLET | Freq: Three times a day (TID) | ORAL | 0 refills | Status: DC | PRN

## 2022-05-15 MED ORDER — LIDOCAINE HCL (CARDIAC) PF 100 MG/5ML IV SOSY
PREFILLED_SYRINGE | INTRAVENOUS | Status: DC | PRN
  Administered 2022-05-15: 100 mg via INTRAVENOUS

## 2022-05-15 MED ORDER — DEXAMETHASONE SODIUM PHOSPHATE 10 MG/ML IJ SOLN
INTRAMUSCULAR | Status: AC
  Filled 2022-05-15: qty 1

## 2022-05-15 MED ORDER — ONDANSETRON HCL 4 MG/2ML IJ SOLN: 4.0000 mg | Freq: Once | INTRAMUSCULAR | Status: DC | PRN

## 2022-05-15 MED ORDER — KETOROLAC TROMETHAMINE 30 MG/ML IJ SOLN
INTRAMUSCULAR | Status: DC | PRN
  Administered 2022-05-15: 30 mg via INTRAVENOUS

## 2022-05-15 MED ORDER — PROPOFOL 10 MG/ML IV BOLUS
INTRAVENOUS | Status: AC
  Filled 2022-05-15: qty 20

## 2022-05-15 MED ORDER — MIDAZOLAM HCL 2 MG/2ML IJ SOLN
INTRAMUSCULAR | Status: AC
  Filled 2022-05-15: qty 2

## 2022-05-15 MED ORDER — ONDANSETRON HCL 4 MG/2ML IJ SOLN
INTRAMUSCULAR | Status: DC | PRN
  Administered 2022-05-15: 4 mg via INTRAVENOUS

## 2022-05-15 MED ORDER — OXYCODONE HCL 5 MG/5ML PO SOLN: 5.0000 mg | Freq: Once | ORAL | Status: DC | PRN

## 2022-05-15 MED ORDER — PROPOFOL 10 MG/ML IV BOLUS
INTRAVENOUS | Status: DC | PRN
  Administered 2022-05-15: 150 mg via INTRAVENOUS

## 2022-05-15 MED ORDER — ACETAMINOPHEN 500 MG PO TABS
1000.0000 mg | ORAL_TABLET | ORAL | Status: AC
  Administered 2022-05-15: 1000 mg via ORAL

## 2022-05-15 MED ORDER — ACETAMINOPHEN 500 MG PO TABS
ORAL_TABLET | ORAL | Status: AC
  Filled 2022-05-15: qty 2

## 2022-05-15 MED ORDER — FENTANYL CITRATE (PF) 100 MCG/2ML IJ SOLN
INTRAMUSCULAR | Status: DC | PRN
Start: 2022-05-15 — End: 2022-05-15
  Administered 2022-05-15 (×2): 50 ug via INTRAVENOUS

## 2022-05-15 MED ORDER — LACTATED RINGERS IV SOLN: INTRAVENOUS | Status: DC

## 2022-05-15 MED ORDER — KETOROLAC TROMETHAMINE 30 MG/ML IJ SOLN
INTRAMUSCULAR | Status: AC
  Filled 2022-05-15: qty 1

## 2022-05-15 MED ORDER — FENTANYL CITRATE (PF) 100 MCG/2ML IJ SOLN: 25.0000 ug | INTRAMUSCULAR | Status: DC | PRN

## 2022-05-15 MED ORDER — KETOROLAC TROMETHAMINE 30 MG/ML IJ SOLN: 30.0000 mg | Freq: Once | INTRAMUSCULAR | Status: DC | PRN

## 2022-05-15 SURGICAL SUPPLY — 19 items
CATH ROBINSON RED A/P 16FR (CATHETERS) ×2 IMPLANT
DEVICE MYOSURE LITE (MISCELLANEOUS) IMPLANT
DEVICE MYOSURE REACH (MISCELLANEOUS) IMPLANT
DILATOR CANAL MILEX (MISCELLANEOUS) IMPLANT
DRSG TELFA 3X8 NADH (GAUZE/BANDAGES/DRESSINGS) ×2 IMPLANT
GAUZE 4X4 16PLY ~~LOC~~+RFID DBL (SPONGE) ×4 IMPLANT
GLOVE BIO SURGEON STRL SZ 6.5 (GLOVE) ×2 IMPLANT
GOWN STRL REUS W/TWL LRG LVL3 (GOWN DISPOSABLE) ×2 IMPLANT
IV NS IRRIG 3000ML ARTHROMATIC (IV SOLUTION) ×2 IMPLANT
KIT PROCEDURE FLUENT (KITS) ×2 IMPLANT
KIT TURNOVER CYSTO (KITS) ×2 IMPLANT
MYOSURE XL FIBROID (MISCELLANEOUS)
PACK VAGINAL MINOR WOMEN LF (CUSTOM PROCEDURE TRAY) ×2 IMPLANT
PAD DRESSING TELFA 3X8 NADH (GAUZE/BANDAGES/DRESSINGS) ×1 IMPLANT
PAD OB MATERNITY 4.3X12.25 (PERSONAL CARE ITEMS) ×2 IMPLANT
SEAL CERVICAL OMNI LOK (ABLATOR) IMPLANT
SEAL ROD LENS SCOPE MYOSURE (ABLATOR) ×2 IMPLANT
SYSTEM TISS REMOVAL MYOSURE XL (MISCELLANEOUS) IMPLANT
TOWEL OR 17X26 10 PK STRL BLUE (TOWEL DISPOSABLE) ×2 IMPLANT

## 2022-05-15 NOTE — Anesthesia Procedure Notes (Signed)
Procedure Name: LMA Insertion Date/Time: 05/15/2022 8:49 AM  Performed by: Justice Rocher, CRNAPre-anesthesia Checklist: Patient identified, Emergency Drugs available, Suction available, Patient being monitored and Timeout performed Patient Re-evaluated:Patient Re-evaluated prior to induction Oxygen Delivery Method: Circle system utilized Preoxygenation: Pre-oxygenation with 100% oxygen Induction Type: IV induction Ventilation: Mask ventilation without difficulty LMA: LMA inserted LMA Size: 4.0 Number of attempts: 1 Airway Equipment and Method: Bite block Placement Confirmation: positive ETCO2, CO2 detector and breath sounds checked- equal and bilateral Tube secured with: Tape Dental Injury: Teeth and Oropharynx as per pre-operative assessment

## 2022-05-15 NOTE — Anesthesia Postprocedure Evaluation (Signed)
Anesthesia Post Note  Patient: [name]  Procedure(s) Performed: DILATATION & CURETTAGE/HYSTEROSCOPY     Patient location during evaluation: PACU Anesthesia Type: General Level of consciousness: awake and alert Pain management: pain level controlled Vital Signs Assessment: post-procedure vital signs reviewed and stable Respiratory status: spontaneous breathing, nonlabored ventilation, respiratory function stable and patient connected to nasal cannula oxygen Cardiovascular status: blood pressure returned to baseline and stable Postop Assessment: no apparent nausea or vomiting Anesthetic complications: no   No notable events documented.  Last Vitals:  Vitals:   05/15/22 1030 05/15/22 1059  BP: 107/74 117/71  Pulse: (!) 53 (!) 51  Resp: 14 14  Temp:  36.6 C  SpO2: 100% 99%    Last Pain:  Vitals:   05/15/22 1059  PainSc: [place] Rosario Kushner

## 2022-05-15 NOTE — Progress Notes (Signed)
Update to History and Physical  No marked change in status since office preop visit.  No further postmenopausal bleeding. Patient examined.  OK to proceed with surgery.

## 2022-05-15 NOTE — Discharge Instructions (Addendum)
Hi [name],   I found thick scar tissue inside the uterine cavity.   I did the curettage procedure.   Everything went well.   Josefa Half, MD   Post Anesthesia Home Care Instructions  Activity: Get plenty of rest for the remainder of the day. A responsible individual must stay with you for 24 hours following the procedure.  For the next 24 hours, DO NOT: -Drive a car -Paediatric nurse -Drink alcoholic beverages -Take any medication unless instructed by your physician -Make any legal decisions or sign important papers.  Meals: Start with liquid foods such as gelatin or soup. Progress to regular foods as tolerated. Avoid greasy, spicy, heavy foods. If nausea and/or vomiting occur, drink only clear liquids until the nausea and/or vomiting subsides. Call your physician if vomiting continues.  Special Instructions/Symptoms: Your throat may feel dry or sore from the anesthesia or the breathing tube placed in your throat during surgery. If this causes discomfort, gargle with warm salt water. The discomfort should disappear within 24 hours.  May take Tylenol as needed for cramping/discomfort beginning at 2 PM.

## 2022-05-15 NOTE — Transfer of Care (Signed)
Immediate Anesthesia Transfer of Care Note  Patient: [name]  Procedure(s) Performed: Procedure(s) (LRB): DILATATION & CURETTAGE/HYSTEROSCOPY (N/A)  Patient Location: PACU  Anesthesia Type: General  Level of Consciousness: awake, sedated, patient cooperative and responds to stimulation  Airway & Oxygen Therapy: Patient Spontanous Breathing and Patient connected to [place] 02   Post-op Assessment: Report given to PACU RN, Post -op Vital signs reviewed and stable and Patient moving all extremities  Post vital signs: Reviewed and stable  Complications: No apparent anesthesia complications

## 2022-05-15 NOTE — Anesthesia Preprocedure Evaluation (Signed)
Anesthesia Evaluation  Patient identified by MRN, date of birth, ID band Patient awake    Reviewed: Allergy & Precautions, NPO status , Patient's Chart, lab work & pertinent test results  Airway Mallampati: II  TM Distance: >3 FB Neck ROM: Full    Dental no notable dental hx.    Pulmonary neg pulmonary ROS,    Pulmonary exam normal breath sounds clear to auscultation       Cardiovascular negative cardio ROS Normal cardiovascular exam Rhythm:Regular Rate:Normal     Neuro/Psych negative neurological ROS  negative psych ROS   GI/Hepatic negative GI ROS, Neg liver ROS,   Endo/Other  negative endocrine ROS  Renal/GU negative Renal ROS  negative genitourinary   Musculoskeletal negative musculoskeletal ROS (+)   Abdominal   Peds negative pediatric ROS (+)  Hematology negative hematology ROS (+)   Anesthesia Other Findings   Reproductive/Obstetrics negative OB ROS                             Anesthesia Physical Anesthesia Plan  ASA: 1  Anesthesia Plan: General   Post-op Pain Management: Minimal or no pain anticipated   Induction: Intravenous  PONV Risk Score and Plan: 3 and Ondansetron, Dexamethasone, Midazolam and Treatment may vary due to age or medical condition  Airway Management Planned: LMA  Additional Equipment:   Intra-op Plan:   Post-operative Plan: Extubation in OR  Informed Consent: I have reviewed the patients History and Physical, chart, labs and discussed the procedure including the risks, benefits and alternatives for the proposed anesthesia with the patient or authorized representative who has indicated his/her understanding and acceptance.     Dental advisory given  Plan Discussed with: CRNA and Surgeon  Anesthesia Plan Comments:         Anesthesia Quick Evaluation

## 2022-05-15 NOTE — Op Note (Signed)
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  SURGEON:  Lenard Galloway, MD  ANESTHESIA:  LMA, paracervical block with 10 mL of 1% lidocaine.  IV FLUIDS:  600 cc LR  EBL:   5 cc  URINE OUTPUT:   140 cc  NORMAL SALINE DEFICIT:    062 cc  COMPLICATIONS:  None.  INDICATIONS FOR THE PROCEDURE:     The patient is a 51 year old G54P2 Caucasian female who presents with postmenopausal bleeding.  Pelvic ultrasound showed calcification in the lower uterine segment.   The patient was unable to tolerate a sonohysterogram/endometrial biopsy in the office setting.   A plan is now made to proceed with a hysteroscopy with dilation and curettage and possible Myosurgical resection of endometrial mass; after risks, benefits and alternatives were reviewed.  FINDINGS:  Exam under anesthesia revealed a small anteverted, mobile uterus.  No adnexal masses were noted.  The uterus was sounded to 6 cm.  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 The tubal ostia regions were visualized.   Endometrial curettings were scant.   SPECIMENS:   endometrial curettings were sent to pathology.   PROCEDURE IN DETAIL:  The patient was reidentified in the preoperative hold area.  She received TED hose and PAS stockings for DVT prophylaxis.  In the operating room, the patient was placed in the dorsal lithotomy position and then an LMA anesthetic was introduced.  The patient's lower abdomen, vagina and perineum were sterilely prepped with Betadine and the  patient's bladder was catheterized of urine.  She was sterilly draped.  An exam under anesthesia was performed.  A speculum was placed inside the vagina and a single-tooth tenaculum  was placed on the anterior cervical lip.  A paracervical block was performed with a total of 10 mL of 1% lidocaine plain.  The uterus was sounded. The cervix was dilated to a #19 Pratt dilator.  The MyoSure hysteroscope was then inserted inside the uterine cavity under the continuous infusion of normal saline solution.   Findings are as noted above.    The serrated and then sharp curettes were introduced into the uterine cavity and the endometrium was curetted in all 4 quadrants.   A minimal amount of endometrial curettings was obtained.  This specimen was sent to Pathology.  The single-tooth tenaculum which had been placed on the anterior cervical lip was removed.     Hemostasis was good, and all of the vaginal instruments were removed.  The patient was awakened and escorted to the recovery room in stable condition after she was cleansed of Betadine.  There were no complications to the procedure.   All needle, instrument and sponge counts were correct.  Lenard Galloway, MD

## 2022-05-16 ENCOUNTER — Encounter (HOSPITAL_BASED_OUTPATIENT_CLINIC_OR_DEPARTMENT_OTHER): Payer: Self-pay | Admitting: Obstetrics and Gynecology

## 2022-05-16 LAB — SURGICAL PATHOLOGY

## 2022-05-23 ENCOUNTER — Encounter: Payer: Self-pay | Admitting: Obstetrics and Gynecology

## 2022-05-23 ENCOUNTER — Ambulatory Visit (INDEPENDENT_AMBULATORY_CARE_PROVIDER_SITE_OTHER): Payer: BC Managed Care – PPO | Admitting: Obstetrics and Gynecology

## 2022-05-23 VITALS — BP 110/74 | Ht 65.0 in | Wt 139.0 lb

## 2022-05-23 DIAGNOSIS — Z9889 Other specified postprocedural states: Secondary | ICD-10-CM

## 2022-05-23 NOTE — Progress Notes (Signed)
GYNECOLOGY  VISIT   HPI: 51 y.o.   Married  Caucasian  female   G2P2002 with Patient's last menstrual period was 10/18/2016 (approximate).   here for 1 week status post DILATATION & CURETTAGE/HYSTEROSCOPY. Surgery done for a history of postmenopausal bleeding.   A uterine septum versus adhesions were noted at the time of her surgery.   The final pathology report showed benign endometrial polyps.   Very little spotting following the hysteroscopy.   Traveling to [place] tomorrow.   GYNECOLOGIC HISTORY: Patient's last menstrual period was 10/18/2016 (approximate). Contraception:  Vasectomy/PMP Menopausal hormone therapy:  none Last mammogram:  04-18-22 Neg/Birads1 Last pap smear: 04-26-22 Neg:Neg HR HPV, 08-05-19 Neg:Neg HR HPV, 12-27-16 Neg:Neg HR HPV               OB History     Gravida  2   Para  2   Term  2   Preterm      AB      Living  2      SAB      IAB      Ectopic      Multiple      Live Births                 Patient Active Problem List   Diagnosis Date Noted   Hypoglycemia    Impingement syndrome of right shoulder region 01/14/2019   Attention deficit 08/28/2011   Mitral valve prolapse 07/14/2011   Migraine headache 07/14/2011   Anxiety 07/14/2011    Past Medical History:  Diagnosis Date   Abnormal Pap smear of cervix 2003   neg colposcopy--no treatment to cervix   Allergy    Anemia    Anxiety    Blood transfusion without reported diagnosis 2003   Hx placenta accreta   Cancer ([place]) 12/2016   Basal cell of nose   Endometriosis    History of COVID-19 05/2021   Hypoglycemia    Infertility, female    Migraine    occ   Mitral valve prolapse    mild no cardiologist   PMB (postmenopausal bleeding) 05/05/2022   Urinary incontinence     Past Surgical History:  Procedure Laterality Date   BARTHOLIN GLAND CYST EXCISION     drained per pt   colonscopy  2021   [place] N/A 05/15/2022    Procedure: DILATATION & CURETTAGE/HYSTEROSCOPY;  Surgeon: Nunzio Cobbs, MD;  Location: [facility];  Service: Gynecology;  Laterality: N/A;   IVF     swdaton with egg retrieval    LAPAROSCOPIC ENDOMETRIOSIS FULGURATION  2000   PELVIC LAPAROSCOPY     WISDOM TOOTH EXTRACTION      Current Outpatient Medications  Medication Sig Dispense Refill   MELATONIN PO Take 5 mg by mouth at bedtime.      No current facility-administered medications for this visit.     ALLERGIES: Sulfa antibiotics  Family History  Problem Relation Age of Onset   Asthma Mother    Cancer Father 29       prostate cancer   Hyperlipidemia Father    Prostate cancer Father    Aneurysm Sister        with brain aneurysm--doing well   Cerebral palsy Daughter    Breast cancer Maternal Aunt 68       A & W--BRCA neg   Diabetes Maternal Aunt    Stroke Paternal Grandmother    Aneurysm Paternal Aunt  dec brain aneurysm   Colon cancer Neg Hx    Colon polyps Neg Hx    Esophageal cancer Neg Hx    Rectal cancer Neg Hx    Stomach cancer Neg Hx     Social History   Socioeconomic History   Marital status: Married    Spouse name: Not on file   Number of children: Not on file   Years of education: Not on file   Highest education level: Not on file  Occupational History   Not on file  Tobacco Use   Smoking status: Never   Smokeless tobacco: Never  Vaping Use   Vaping Use: Never used  Substance and Sexual Activity   Alcohol use: Yes    Comment: occ. glass of wine   Drug use: No   Sexual activity: Yes    Partners: Male    Birth control/protection: Other-see comments    Comment: partner with vasectomy  Other Topics Concern   Not on file  Social History Narrative   Not on file   Social Determinants of Health   Financial Resource Strain: Not on file  Food Insecurity: Not on file  Transportation Needs: Not on file  Physical Activity: Not on file  Stress: Not on file   Social Connections: Not on file  Intimate Partner Violence: Not on file    Review of Systems  All other systems reviewed and are negative.   PHYSICAL EXAMINATION:    BP 110/74   Ht $R'5\' 5"'ax$  (1.651 m)   Wt 139 lb (63 kg)   LMP 10/18/2016 (Approximate)   BMI 23.13 kg/m     General appearance: alert, cooperative and appears stated age   Pelvic: External genitalia:  no lesions              Urethra:  normal appearing urethra with no masses, tenderness or lesions              Bartholins and Skenes: normal                 Vagina: normal appearing vagina with normal color and discharge, no lesions              Cervix: no lesions                Bimanual Exam:  Uterus:  normal size, contour, position, consistency, mobility, non-tender              Adnexa: no mass, fullness, tenderness     Chaperone was present for exam:  Maudie Mercury, CMA.  ASSESSMENT  Hx postmenopausal bleeding.  I suspect atrophy was the cause.  Status post hysteroscopy with dilation and curettage.  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   PLAN  Surgical findings, procedure, and pathology report reviewed with patient.  Ok to resume all normal activities.  Fu for yearly annual exam in May.    An After Visit Summary was printed and given to the patient.

## 2022-06-14 ENCOUNTER — Ambulatory Visit: Payer: BC Managed Care – PPO | Admitting: Internal Medicine

## 2022-06-14 ENCOUNTER — Encounter: Payer: Self-pay | Admitting: Internal Medicine

## 2022-06-14 ENCOUNTER — Telehealth: Payer: Self-pay | Admitting: Internal Medicine

## 2022-06-14 VITALS — BP 108/68 | HR 86 | Temp 98.6°F

## 2022-06-14 DIAGNOSIS — H1032 Unspecified acute conjunctivitis, left eye: Secondary | ICD-10-CM

## 2022-06-14 DIAGNOSIS — H6693 Otitis media, unspecified, bilateral: Secondary | ICD-10-CM | POA: Diagnosis not present

## 2022-06-14 DIAGNOSIS — J029 Acute pharyngitis, unspecified: Secondary | ICD-10-CM

## 2022-06-14 MED ORDER — OFLOXACIN 0.3 % OP SOLN: 1.0000 [drp] | Freq: Four times a day (QID) | OPHTHALMIC | 0 refills | Status: DC

## 2022-06-14 MED ORDER — AZITHROMYCIN 250 MG PO TABS: ORAL_TABLET | ORAL | 0 refills | Status: AC

## 2022-06-14 NOTE — Progress Notes (Signed)
   Subjective:    Patient ID: [name], female    DOB: 03-05-1971, 51 y.o.   MRN: [id number]  HPI  51 year old Female seen for respiratory infection symptoms with sore throat nasal congestion headache fatigue that started late last week.  She took a home COVID test today and the result was negative.  Her daughter and husband had similar symptoms after returning home from a trip to [place].  She also has noticed some irritation of her left eye and feels that she may have pinkeye.  Her ears feel congested as well.  She underwent D&C with hysteroscopy June 12 by Dr. Quincy Simmonds.  She has a history of migraine headaches, mitral valve prolapse, bilateral carpal tunnel syndrome in 2005.  She says sulfa causes a rash and Levaquin causes insomnia.  Saw Dr. Mare Ferrari in 2006 for palpitations and occasional chest pain.  Was thought to have a faint midsystolic click and a faint systolic murmur.  EKG at the time was unremarkable.  Currently asymptomatic with regard to palpitations.  She does not smoke.  Seldom consumes alcohol.  Married with 2 daughters.  Family history: Mother with history of asthma.    Review of Systems no shaking chills, nausea or vomiting.  Has had slight sore throat.     Objective:   Physical Exam Temperature 98.6 degrees pulse oximetry 98% on room air pulse 86 regular blood pressure 108/68  Has mild conjunctivitis of the left eye with conjunctiva being erythematous.  PERRLA.  Both TMs are full bilaterally and pink with a splayed light reflex.  Neck is supple.  No cervical adenopathy.  Pharynx is slightly injected but no exudate is present.  Chest clear to auscultation without rales or wheezing.  Rapid strep screen is negative     Assessment & Plan:   None strep pharyngitis  Bilateral otitis media  Left conjunctivitis  Plan: Zithromax Z-PAK 2 tabs day 1 followed by 1 tab days 2 through 5.  Ocuflox ophthalmic place drops 1 drops in left eye 4 times a day for 5 days.

## 2022-06-14 NOTE — Patient Instructions (Addendum)
Take Zithromax Z-PAK 2 tabs day 1 followed by 1 tab days 2 through 5.  Ocuflox ophthalmic 1-2  drops in left eye 4 times a day for 5 days.

## 2022-06-14 NOTE — Telephone Encounter (Signed)
COVID Test is negative / do you want to see today or tomorrow?

## 2022-06-14 NOTE — Telephone Encounter (Signed)
[name] [phone]  [name] called to say she has Sore throat, nasal  drip, headache, feverish, tired, that started maybe Friday or Saturday. I have ask her to take a home COVID test and call back with results. She said it may be 10:30 before she can get that done, but she would do that and call back. I let her know once we have that it will determine what type of visit we can do.

## 2022-06-14 NOTE — Telephone Encounter (Signed)
scheduled

## 2022-06-15 LAB — POCT RAPID STREP A (OFFICE): Rapid Strep A Screen: NEGATIVE

## 2022-06-15 NOTE — Addendum Note (Signed)
Addended by: Angus Seller on: 06/15/2022 08:34 AM   Modules accepted: Orders

## 2022-08-19 DIAGNOSIS — Z23 Encounter for immunization: Secondary | ICD-10-CM | POA: Diagnosis not present

## 2022-08-23 DIAGNOSIS — M7501 Adhesive capsulitis of right shoulder: Secondary | ICD-10-CM | POA: Diagnosis not present

## 2022-09-28 DIAGNOSIS — M549 Dorsalgia, unspecified: Secondary | ICD-10-CM | POA: Diagnosis not present

## 2022-09-28 DIAGNOSIS — N62 Hypertrophy of breast: Secondary | ICD-10-CM | POA: Diagnosis not present

## 2022-09-28 DIAGNOSIS — M542 Cervicalgia: Secondary | ICD-10-CM | POA: Diagnosis not present

## 2022-09-28 DIAGNOSIS — L304 Erythema intertrigo: Secondary | ICD-10-CM | POA: Diagnosis not present

## 2022-11-02 DIAGNOSIS — M542 Cervicalgia: Secondary | ICD-10-CM | POA: Diagnosis not present

## 2022-11-02 DIAGNOSIS — N62 Hypertrophy of breast: Secondary | ICD-10-CM | POA: Diagnosis not present

## 2022-11-02 DIAGNOSIS — M549 Dorsalgia, unspecified: Secondary | ICD-10-CM | POA: Diagnosis not present

## 2022-11-02 DIAGNOSIS — L304 Erythema intertrigo: Secondary | ICD-10-CM | POA: Diagnosis not present

## 2022-11-02 NOTE — H&P (Signed)
Subjective:     Patient ID: [name] is a 51 y.o. female.   HPI   Returns for follow up discussion breast reduction. Current E cup. Reports neck and shoulder pain for over year duration. Reports breast size and pain interfere with her ability to lift and care for daughter. Daughter requires assist with transfers.Patient has tried specialty fitted bras, OTC pain medication, PT, steroid shots to shoulder, regular activity/exercise for over 3 month duration without relief. Reports recurrent rashes beneath breasts worse in warm months, not improved with over 3 month hygiene measure, deodorant use. Reports numbness hands.   Wt stable.   MMG 04/2022 normal. MA with post menopausal breast ca.    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 Lives with spouse and one adult daughter with CP. This daughter is a Merchant navy officer, requires full lifting help for transfers and uses WC.   Review of Systems  Musculoskeletal: Positive for neck pain.       +shoulder pain  Skin: Positive for rash.  Allergic/Immunologic: Positive for environmental allergies.  Psychiatric/Behavioral: The patient is nervous/anxious.     Remainder 12 point review negative    Objective:   Physical Exam Cardiovascular:     Rate and Rhythm: Normal rate and regular rhythm.     Heart sounds: Normal heart sounds.  Pulmonary:     Effort: Pulmonary effort is normal.     Breath sounds: Normal breath sounds.  Lymphadenopathy:     Upper Body:     Right upper body: No axillary adenopathy.     Left upper body: No axillary adenopathy.     +shoulder grooving Breasts: no masses grade 2 ptosis right, left grade 1 ptosis SN to nipple R 25 L 26 cm BW R 20 L 19 cm Nipple to IMF R 12 L 11 cm      Assessment:     Macromastia Chronic neck and back pain Intertrigo    Plan:     Chronic neck back and shoulder pain, intertrigo, that has failed conservative measures in  setting of macromastia. No other cause of back pain or intertrigo noted. There is a reasonable likelihood that the symptoms are primarily due to macromastia; breast reduction surgery has reasonable expectation to improve symptoms.   Reviewed reduction with anchor type scars, OP surgery, drains, post operative visits and limitations, recovery. Diminished sensation nipple and breast skin, risk of nipple loss, wound healing problems, asymmetry, incidental carcinoma, changes with wt gain/loss, aging, unacceptable cosmetic appearance reviewed. Cannot assure her cup size.   Additional risks including but not limited to bleeding, hematoma, seroma, infection, damage to adjacent structures, need for additional procedures, blood clots in legs or lungs reviewed.    Drain teaching completed. Rx for tramadol given.   Anticipate 338 g reduction from each breast

## 2022-11-03 HISTORY — PX: REDUCTION MAMMAPLASTY: SUR839

## 2022-11-09 ENCOUNTER — Encounter (HOSPITAL_BASED_OUTPATIENT_CLINIC_OR_DEPARTMENT_OTHER): Payer: Self-pay | Admitting: Plastic Surgery

## 2022-11-13 NOTE — Progress Notes (Signed)

## 2022-11-17 ENCOUNTER — Ambulatory Visit (HOSPITAL_BASED_OUTPATIENT_CLINIC_OR_DEPARTMENT_OTHER): Payer: BC Managed Care – PPO | Admitting: Certified Registered"

## 2022-11-17 ENCOUNTER — Ambulatory Visit (HOSPITAL_BASED_OUTPATIENT_CLINIC_OR_DEPARTMENT_OTHER)
Admission: RE | Admit: 2022-11-17 | Discharge: 2022-11-17 | Disposition: A | Discharge status: Home / Self Care | Payer: BC Managed Care – PPO | Attending: Plastic Surgery | Admitting: Plastic Surgery

## 2022-11-17 ENCOUNTER — Encounter (HOSPITAL_BASED_OUTPATIENT_CLINIC_OR_DEPARTMENT_OTHER): Payer: Self-pay | Admitting: Plastic Surgery

## 2022-11-17 ENCOUNTER — Other Ambulatory Visit: Payer: Self-pay

## 2022-11-17 ENCOUNTER — Encounter (HOSPITAL_BASED_OUTPATIENT_CLINIC_OR_DEPARTMENT_OTHER)
Admission: RE | Disposition: A | Discharge status: Home / Self Care | Payer: Self-pay | Source: Home / Self Care | Attending: Plastic Surgery

## 2022-11-17 DIAGNOSIS — N6012 Diffuse cystic mastopathy of left breast: Secondary | ICD-10-CM | POA: Diagnosis not present

## 2022-11-17 DIAGNOSIS — N62 Hypertrophy of breast: Secondary | ICD-10-CM | POA: Diagnosis not present

## 2022-11-17 DIAGNOSIS — M542 Cervicalgia: Secondary | ICD-10-CM | POA: Diagnosis not present

## 2022-11-17 DIAGNOSIS — N6011 Diffuse cystic mastopathy of right breast: Secondary | ICD-10-CM | POA: Diagnosis not present

## 2022-11-17 DIAGNOSIS — Z803 Family history of malignant neoplasm of breast: Secondary | ICD-10-CM | POA: Diagnosis not present

## 2022-11-17 DIAGNOSIS — L304 Erythema intertrigo: Secondary | ICD-10-CM | POA: Diagnosis not present

## 2022-11-17 DIAGNOSIS — M549 Dorsalgia, unspecified: Secondary | ICD-10-CM | POA: Diagnosis not present

## 2022-11-17 DIAGNOSIS — E162 Hypoglycemia, unspecified: Secondary | ICD-10-CM

## 2022-11-17 HISTORY — PX: BREAST REDUCTION SURGERY: SHX8

## 2022-11-17 SURGERY — MAMMOPLASTY, REDUCTION
Anesthesia: General | Site: Breast | Laterality: Bilateral

## 2022-11-17 MED ORDER — FENTANYL CITRATE (PF) 100 MCG/2ML IJ SOLN
25.0000 ug | INTRAMUSCULAR | Status: DC | PRN
  Administered 2022-11-17: 50 ug via INTRAVENOUS

## 2022-11-17 MED ORDER — FENTANYL CITRATE (PF) 100 MCG/2ML IJ SOLN
INTRAMUSCULAR | Status: AC
  Filled 2022-11-17: qty 2

## 2022-11-17 MED ORDER — DEXAMETHASONE SODIUM PHOSPHATE 10 MG/ML IJ SOLN
INTRAMUSCULAR | Status: DC | PRN
  Administered 2022-11-17: 10 mg via INTRAVENOUS

## 2022-11-17 MED ORDER — ROCURONIUM BROMIDE 10 MG/ML (PF) SYRINGE
PREFILLED_SYRINGE | INTRAVENOUS | Status: AC
  Filled 2022-11-17: qty 10

## 2022-11-17 MED ORDER — CEFAZOLIN SODIUM-DEXTROSE 2-4 GM/100ML-% IV SOLN
2.0000 g | INTRAVENOUS | Status: AC
  Administered 2022-11-17: 2 g via INTRAVENOUS

## 2022-11-17 MED ORDER — PROPOFOL 10 MG/ML IV BOLUS
INTRAVENOUS | Status: AC
  Filled 2022-11-17: qty 20

## 2022-11-17 MED ORDER — OXYCODONE HCL 5 MG PO TABS
ORAL_TABLET | ORAL | Status: AC
  Filled 2022-11-17: qty 1

## 2022-11-17 MED ORDER — BUPIVACAINE HCL (PF) 0.5 % IJ SOLN
INTRAMUSCULAR | Status: DC | PRN
  Administered 2022-11-17: 30 mL

## 2022-11-17 MED ORDER — DEXAMETHASONE SODIUM PHOSPHATE 10 MG/ML IJ SOLN
INTRAMUSCULAR | Status: AC
  Filled 2022-11-17: qty 1

## 2022-11-17 MED ORDER — FENTANYL CITRATE (PF) 100 MCG/2ML IJ SOLN
INTRAMUSCULAR | Status: DC | PRN
  Administered 2022-11-17 (×2): 25 ug via INTRAVENOUS
  Administered 2022-11-17: 50 ug via INTRAVENOUS
  Administered 2022-11-17: 100 ug via INTRAVENOUS

## 2022-11-17 MED ORDER — ACETAMINOPHEN 500 MG PO TABS: 1000.0000 mg | ORAL_TABLET | Freq: Once | ORAL | Status: DC

## 2022-11-17 MED ORDER — ROCURONIUM BROMIDE 100 MG/10ML IV SOLN
INTRAVENOUS | Status: DC | PRN
  Administered 2022-11-17: 60 mg via INTRAVENOUS
  Administered 2022-11-17: 30 mg via INTRAVENOUS

## 2022-11-17 MED ORDER — GABAPENTIN 300 MG PO CAPS
300.0000 mg | ORAL_CAPSULE | ORAL | Status: AC
  Administered 2022-11-17: 300 mg via ORAL

## 2022-11-17 MED ORDER — ONDANSETRON HCL 4 MG/2ML IJ SOLN
INTRAMUSCULAR | Status: AC
  Filled 2022-11-17: qty 2

## 2022-11-17 MED ORDER — OXYCODONE HCL 5 MG PO TABS
5.0000 mg | ORAL_TABLET | Freq: Once | ORAL | Status: AC | PRN
  Administered 2022-11-17: 5 mg via ORAL

## 2022-11-17 MED ORDER — PHENYLEPHRINE 80 MCG/ML (10ML) SYRINGE FOR IV PUSH (FOR BLOOD PRESSURE SUPPORT)
PREFILLED_SYRINGE | INTRAVENOUS | Status: AC
  Filled 2022-11-17: qty 20

## 2022-11-17 MED ORDER — CELECOXIB 200 MG PO CAPS
ORAL_CAPSULE | ORAL | Status: AC
  Filled 2022-11-17: qty 1

## 2022-11-17 MED ORDER — SUGAMMADEX SODIUM 200 MG/2ML IV SOLN
INTRAVENOUS | Status: DC | PRN
  Administered 2022-11-17: 120 mg via INTRAVENOUS

## 2022-11-17 MED ORDER — LIDOCAINE 2% (20 MG/ML) 5 ML SYRINGE
INTRAMUSCULAR | Status: DC | PRN
  Administered 2022-11-17: 60 mg via INTRAVENOUS

## 2022-11-17 MED ORDER — ONDANSETRON HCL 4 MG/2ML IJ SOLN
INTRAMUSCULAR | Status: DC | PRN
  Administered 2022-11-17: 4 mg via INTRAVENOUS

## 2022-11-17 MED ORDER — 0.9 % SODIUM CHLORIDE (POUR BTL) OPTIME
TOPICAL | Status: DC | PRN
  Administered 2022-11-17: 1000 mL

## 2022-11-17 MED ORDER — LACTATED RINGERS IV SOLN: INTRAVENOUS | Status: DC

## 2022-11-17 MED ORDER — CEFAZOLIN SODIUM-DEXTROSE 2-4 GM/100ML-% IV SOLN
INTRAVENOUS | Status: AC
  Filled 2022-11-17: qty 100

## 2022-11-17 MED ORDER — EPHEDRINE 5 MG/ML INJ
INTRAVENOUS | Status: AC
  Filled 2022-11-17: qty 5

## 2022-11-17 MED ORDER — ACETAMINOPHEN 500 MG PO TABS
1000.0000 mg | ORAL_TABLET | ORAL | Status: AC
  Administered 2022-11-17: 1000 mg via ORAL

## 2022-11-17 MED ORDER — EPHEDRINE SULFATE (PRESSORS) 50 MG/ML IJ SOLN
INTRAMUSCULAR | Status: DC | PRN
  Administered 2022-11-17: 5 mg via INTRAVENOUS

## 2022-11-17 MED ORDER — CELECOXIB 200 MG PO CAPS
200.0000 mg | ORAL_CAPSULE | ORAL | Status: AC
  Administered 2022-11-17: 200 mg via ORAL

## 2022-11-17 MED ORDER — PROPOFOL 10 MG/ML IV BOLUS
INTRAVENOUS | Status: DC | PRN
  Administered 2022-11-17: 30 mg via INTRAVENOUS
  Administered 2022-11-17: 140 mg via INTRAVENOUS
  Administered 2022-11-17: 30 mg via INTRAVENOUS

## 2022-11-17 MED ORDER — MIDAZOLAM HCL 2 MG/2ML IJ SOLN
INTRAMUSCULAR | Status: AC
  Filled 2022-11-17: qty 2

## 2022-11-17 MED ORDER — CHLORHEXIDINE GLUCONATE CLOTH 2 % EX PADS: 6.0000 | MEDICATED_PAD | Freq: Once | CUTANEOUS | Status: DC

## 2022-11-17 MED ORDER — PHENYLEPHRINE HCL (PRESSORS) 10 MG/ML IV SOLN
INTRAVENOUS | Status: DC | PRN
  Administered 2022-11-17 (×3): 40 ug via INTRAVENOUS

## 2022-11-17 MED ORDER — LIDOCAINE 2% (20 MG/ML) 5 ML SYRINGE
INTRAMUSCULAR | Status: AC
  Filled 2022-11-17: qty 5

## 2022-11-17 MED ORDER — ACETAMINOPHEN 500 MG PO TABS
ORAL_TABLET | ORAL | Status: AC
  Filled 2022-11-17: qty 2

## 2022-11-17 MED ORDER — MIDAZOLAM HCL 5 MG/5ML IJ SOLN
INTRAMUSCULAR | Status: DC | PRN
  Administered 2022-11-17: 2 mg via INTRAVENOUS

## 2022-11-17 MED ORDER — GABAPENTIN 300 MG PO CAPS
ORAL_CAPSULE | ORAL | Status: AC
  Filled 2022-11-17: qty 1

## 2022-11-17 MED ORDER — OXYCODONE HCL 5 MG/5ML PO SOLN: 5.0000 mg | Freq: Once | ORAL | Status: AC | PRN

## 2022-11-17 SURGICAL SUPPLY — 50 items
ADH SKN CLS APL DERMABOND .7 (GAUZE/BANDAGES/DRESSINGS) ×2
APL PRP STRL LF DISP 70% ISPRP (MISCELLANEOUS) ×2
BINDER BREAST 3XL (GAUZE/BANDAGES/DRESSINGS) IMPLANT
BINDER BREAST LRG (GAUZE/BANDAGES/DRESSINGS) IMPLANT
BINDER BREAST MEDIUM (GAUZE/BANDAGES/DRESSINGS) IMPLANT
BINDER BREAST XLRG (GAUZE/BANDAGES/DRESSINGS) IMPLANT
BINDER BREAST XXLRG (GAUZE/BANDAGES/DRESSINGS) IMPLANT
BLADE SURG 10 STRL SS (BLADE) ×4 IMPLANT
BNDG GAUZE DERMACEA FLUFF 4 (GAUZE/BANDAGES/DRESSINGS) ×2 IMPLANT
BNDG GZE DERMACEA 4 6PLY (GAUZE/BANDAGES/DRESSINGS) ×2
CANISTER SUCT 1200ML W/VALVE (MISCELLANEOUS) ×1 IMPLANT
CHLORAPREP W/TINT 26 (MISCELLANEOUS) ×2 IMPLANT
COVER BACK TABLE 60X90IN (DRAPES) ×1 IMPLANT
COVER MAYO STAND STRL (DRAPES) ×1 IMPLANT
DERMABOND ADVANCED .7 DNX12 (GAUZE/BANDAGES/DRESSINGS) ×2 IMPLANT
DRAIN CHANNEL 15F RND FF W/TCR (WOUND CARE) IMPLANT
DRAIN CHANNEL 19F RND (DRAIN) IMPLANT
DRAPE TOP ARMCOVERS (MISCELLANEOUS) ×1 IMPLANT
DRAPE U-SHAPE 76X120 STRL (DRAPES) ×1 IMPLANT
DRAPE UTILITY XL STRL (DRAPES) ×1 IMPLANT
ELECT COATED BLADE 2.86 ST (ELECTRODE) ×1 IMPLANT
ELECT REM PT RETURN 9FT ADLT (ELECTROSURGICAL) ×1
ELECTRODE REM PT RTRN 9FT ADLT (ELECTROSURGICAL) ×1 IMPLANT
EVACUATOR SILICONE 100CC (DRAIN) IMPLANT
GAUZE PAD ABD 8X10 STRL (GAUZE/BANDAGES/DRESSINGS) ×2 IMPLANT
GLOVE BIO SURGEON STRL SZ 6 (GLOVE) ×2 IMPLANT
GOWN STRL REUS W/ TWL LRG LVL3 (GOWN DISPOSABLE) ×2 IMPLANT
GOWN STRL REUS W/TWL LRG LVL3 (GOWN DISPOSABLE) ×3
MARKER SKIN DUAL TIP RULER LAB (MISCELLANEOUS) IMPLANT
NDL HYPO 25X1 1.5 SAFETY (NEEDLE) ×1 IMPLANT
NEEDLE HYPO 25X1 1.5 SAFETY (NEEDLE) ×1 IMPLANT
NS IRRIG 1000ML POUR BTL (IV SOLUTION) ×1 IMPLANT
PACK BASIN DAY SURGERY FS (CUSTOM PROCEDURE TRAY) ×1 IMPLANT
PENCIL SMOKE EVACUATOR (MISCELLANEOUS) ×1 IMPLANT
PIN SAFETY STERILE (MISCELLANEOUS) ×1 IMPLANT
SHEET MEDIUM DRAPE 40X70 STRL (DRAPES) ×2 IMPLANT
SLEEVE SCD COMPRESS KNEE MED (STOCKING) ×1 IMPLANT
SPONGE T-LAP 18X18 ~~LOC~~+RFID (SPONGE) ×3 IMPLANT
STAPLER VISISTAT 35W (STAPLE) ×1 IMPLANT
SUT ETHILON 2 0 FS 18 (SUTURE) IMPLANT
SUT MNCRL AB 4-0 PS2 18 (SUTURE) IMPLANT
SUT VIC AB 3-0 PS1 18 (SUTURE) ×7
SUT VIC AB 3-0 PS1 18XBRD (SUTURE) IMPLANT
SUT VICRYL 4-0 PS2 18IN ABS (SUTURE) IMPLANT
SYR BULB IRRIG 60ML STRL (SYRINGE) ×1 IMPLANT
SYR CONTROL 10ML LL (SYRINGE) ×1 IMPLANT
TOWEL GREEN STERILE FF (TOWEL DISPOSABLE) ×2 IMPLANT
TUBE CONNECTING 20X1/4 (TUBING) ×1 IMPLANT
UNDERPAD 30X36 HEAVY ABSORB (UNDERPADS AND DIAPERS) ×2 IMPLANT
YANKAUER SUCT BULB TIP NO VENT (SUCTIONS) ×1 IMPLANT

## 2022-11-17 NOTE — Anesthesia Procedure Notes (Signed)
Procedure Name: Intubation Date/Time: 11/17/2022 10:16 AM  Performed by: Lavonia Dana, CRNAPre-anesthesia Checklist: Patient identified, Emergency Drugs available, Suction available and Patient being monitored Patient Re-evaluated:Patient Re-evaluated prior to induction Oxygen Delivery Method: Circle system utilized Preoxygenation: Pre-oxygenation with 100% oxygen Induction Type: IV induction Ventilation: Mask ventilation without difficulty Laryngoscope Size: Mac and 3 Grade View: Grade I Tube type: Oral Tube size: 7.0 mm Number of attempts: 1 Airway Equipment and Method: Stylet and Bite block Placement Confirmation: ETT inserted through vocal cords under direct vision, positive ETCO2 and breath sounds checked- equal and bilateral Secured at: 22 cm Tube secured with: Tape Dental Injury: Teeth and Oropharynx as per pre-operative assessment

## 2022-11-17 NOTE — Anesthesia Postprocedure Evaluation (Signed)
Anesthesia Post Note  Patient: [name]  Procedure(s) Performed: MAMMARY REDUCTION  (BREAST) (Bilateral: Breast)     Patient location during evaluation: PACU Anesthesia Type: General Level of consciousness: awake and alert Pain management: pain level controlled Vital Signs Assessment: post-procedure vital signs reviewed and stable Respiratory status: spontaneous breathing, nonlabored ventilation, respiratory function stable and patient connected to nasal cannula oxygen Cardiovascular status: blood pressure returned to baseline and stable Postop Assessment: no apparent nausea or vomiting Anesthetic complications: no   No notable events documented.  Last Vitals:  Vitals:   11/17/22 1400 11/17/22 1431  BP: 111/71 124/78  Pulse: 72 68  Resp: 12 16  Temp:  36.8 C  SpO2: 97% 98%    Last Pain:  Vitals:   11/17/22 1445  TempSrc:   PainSc: 3                  Shine Scrogham P Tiyonna Sardinha

## 2022-11-17 NOTE — Discharge Instructions (Signed)
  NO tylenol or ibuprofen until 3:50 p.m.   Post Anesthesia Home Care Instructions  Activity: Get plenty of rest for the remainder of the day. A responsible individual must stay with you for 24 hours following the procedure.  For the next 24 hours, DO NOT: -Drive a car -Paediatric nurse -Drink alcoholic beverages -Take any medication unless instructed by your physician -Make any legal decisions or sign important papers.  Meals: Start with liquid foods such as gelatin or soup. Progress to regular foods as tolerated. Avoid greasy, spicy, heavy foods. If nausea and/or vomiting occur, drink only clear liquids until the nausea and/or vomiting subsides. Call your physician if vomiting continues.  Special Instructions/Symptoms: Your throat may feel dry or sore from the anesthesia or the breathing tube placed in your throat during surgery. If this causes discomfort, gargle with warm salt water. The discomfort should disappear within 24 hours.  If you had a scopolamine patch placed behind your ear for the management of post- operative nausea and/or vomiting:  1. The medication in the patch is effective for 72 hours, after which it should be removed.  Wrap patch in a tissue and discard in the trash. Wash hands thoroughly with soap and water. 2. You may remove the patch earlier than 72 hours if you experience unpleasant side effects which may include dry mouth, dizziness or visual disturbances. 3. Avoid touching the patch. Wash your hands with soap and water after contact with the patch.      JP Drain [employer] this sheet to all of your post-operative appointments while you have your drains. Please measure your drains by CC's or ML's. Make sure you drain and measure your JP Drains 2 or 3 times per day. At the end of each day, add up totals for the left side and add up totals for the right side.    ( 9 am )     ( 3 pm )        ( 9 pm )                Date L  R  L  R  L  R  Total L/R

## 2022-11-17 NOTE — Anesthesia Preprocedure Evaluation (Signed)
Anesthesia Evaluation  Patient identified by MRN, date of birth, ID band Patient awake    Reviewed: Allergy & Precautions, NPO status , Patient's Chart, lab work & pertinent test results  Airway Mallampati: II  TM Distance: >3 FB Neck ROM: Full    Dental no notable dental hx.    Pulmonary neg pulmonary ROS   Pulmonary exam normal        Cardiovascular negative cardio ROS  Rhythm:Regular Rate:Normal     Neuro/Psych  Headaches  Anxiety        GI/Hepatic negative GI ROS, Neg liver ROS,,,  Endo/Other  negative endocrine ROS    Renal/GU negative Renal ROS  negative genitourinary   Musculoskeletal  (+) Arthritis ,    Abdominal Normal abdominal exam  (+)   Peds  Hematology  (+) Blood dyscrasia, anemia   Anesthesia Other Findings   Reproductive/Obstetrics                              Anesthesia Physical Anesthesia Plan  ASA: 2  Anesthesia Plan: General   Post-op Pain Management: Celebrex PO (pre-op)* and Tylenol PO (pre-op)*   Induction: Intravenous  PONV Risk Score and Plan: 3 and Ondansetron, Dexamethasone, Midazolam and Treatment may vary due to age or medical condition  Airway Management Planned: Mask and Oral ETT  Additional Equipment: None  Intra-op Plan:   Post-operative Plan: Extubation in OR  Informed Consent: I have reviewed the patients History and Physical, chart, labs and discussed the procedure including the risks, benefits and alternatives for the proposed anesthesia with the patient or authorized representative who has indicated his/her understanding and acceptance.     Dental advisory given  Plan Discussed with: CRNA  Anesthesia Plan Comments:          Anesthesia Quick Evaluation

## 2022-11-17 NOTE — Interval H&P Note (Signed)
History and Physical Interval Note:  11/17/2022 9:40 AM  [name]  has presented today for surgery, with the diagnosis of macromastia, chronic neck and back pain, shoulder pain, intertrigo.  The various methods of treatment have been discussed with the patient and family. After consideration of risks, benefits and other options for treatment, the patient has consented to  Procedure(s): MAMMARY REDUCTION  (BREAST) (Bilateral) as a surgical intervention.  The patient's history has been reviewed, patient examined, no change in status, stable for surgery.  I have reviewed the patient's chart and labs.  Questions were answered to the patient's satisfaction.     Arnoldo Hooker Preslea Rhodus

## 2022-11-17 NOTE — Op Note (Signed)
Operative Note   DATE OF OPERATION: 12.15.23  LOCATION: [facility]-outpatient  SURGICAL DIVISION: Plastic Surgery  PREOPERATIVE DIAGNOSES:  1. Macromastia 2.Chronic neck and back pain 3. Intertrigo    POSTOPERATIVE DIAGNOSES:  same  PROCEDURE:  Bilateral breast reduction  SURGEON: Irene Limbo MD MBA  ASSISTANT: none  ANESTHESIA:  General.   EBL: 50 ml  COMPLICATIONS: None immediate.   INDICATIONS FOR PROCEDURE:  The patient, [name], is a 51 y.o. female born on July 30, 1971, is here for treatment chronic neck back pain, intertrigo, in setting of macromastia that has failed conservative measures.   FINDINGS: Right reduction 340 g Left reduction 270 g  DESCRIPTION OF PROCEDURE:  The patient was marked standing in the preoperative area to mark sternal notch, chest midline, anterior axillary lines, inframammary folds. The location of new nipple areolar complex was marked at level of on inframammary fold on anterior surface breast by palpation. This was marked symmetric over bilateral breasts. With aid of Wise pattern marker, location of new nipple areolar complex and vertical limbs 7 cm) were marked by displacement of breasts along meridian. The patient was taken to the operating room. SCDs were placed and IV antibiotics were given. The patient's operative site was prepped and draped in a sterile fashion. A time out was performed and all information was confirmed to be correct.     I began on left breast. Over left breast, superior pedicle marked and nipple areolar complex incised with 38 mm diameter marker. Pedicle deepithlialized and developed to chest wall. Breast tissue resected over lower pole. Medial and lateral flaps developed. Additional medial and lateral breast tissue excised. Breast tailor tacked closed.    I then directed attention to right breast where superior pedicle designed. NAC incised with 38 mm diameter marker. The pedicle was deepithelialized. Pedicle  developed to chest wall. Breast tissue resected over lower pole. Medial and lateral flaps developed. Additional medial and lateral breast tissue excised. Breast tailor tacked closed. Patient brought to upright sitting position and assessed for symmetry. Patient returned to supine position. Breast cavities irrigated and hemostasis obtained. Local anesthetic infiltrated throughout each breast. 15 Fr JP placed in each breast and secured with 2-0 nylon. Closure completed bilateral with 3-0 vicryl to approximate dermis along inframammary fold and vertical limb. NAC inset with 4-0 vicryl in dermis. Skin closure completed with 4-0 monocryl subcuticular throughout. Tissue adhesive applied. Dry dressing and breast binder applied.   The patient was allowed to wake from anesthesia, extubated and taken to the recovery room in satisfactory condition.   SPECIMENS: right and left breast reduction  DRAINS: 15 Fr JP in right and left breast  Irene Limbo, MD [facility] Plastic & Reconstructive Surgery  Office/ physician access line after hours [phone]

## 2022-11-17 NOTE — Transfer of Care (Signed)
Immediate Anesthesia Transfer of Care Note  Patient: [name]  Procedure(s) Performed: MAMMARY REDUCTION  (BREAST) (Bilateral: Breast)  Patient Location: PACU  Anesthesia Type:General  Level of Consciousness: drowsy  Airway & Oxygen Therapy: Patient Spontanous Breathing and Patient connected to face mask oxygen  Post-op Assessment: Report given to RN and Post -op Vital signs reviewed and stable  Post vital signs: Reviewed and stable  Last Vitals:  Vitals Value Taken Time  BP 122/83 11/17/22 1323  Temp    Pulse 83 11/17/22 1326  Resp 17 11/17/22 1326  SpO2 100 % 11/17/22 1326  Vitals shown include unvalidated device data.  Last Pain:  Vitals:   11/17/22 0949  TempSrc: Oral  PainSc: 0-No pain      Patients Stated Pain Goal: 4 (84/73/08 5694)  Complications: No notable events documented.

## 2022-11-19 ENCOUNTER — Encounter (HOSPITAL_BASED_OUTPATIENT_CLINIC_OR_DEPARTMENT_OTHER): Payer: Self-pay | Admitting: Plastic Surgery

## 2022-11-20 LAB — SURGICAL PATHOLOGY

## 2023-03-09 DIAGNOSIS — M545 Low back pain, unspecified: Secondary | ICD-10-CM | POA: Diagnosis not present

## 2023-03-16 DIAGNOSIS — M4186 Other forms of scoliosis, lumbar region: Secondary | ICD-10-CM | POA: Diagnosis not present

## 2023-03-28 NOTE — Progress Notes (Signed)
52 y.o. G21P2002 Married Caucasian female here for annual exam.    No vaginal bleeding.   Had a breast reduction in December, 2023.   Has lost weight intentionally, about 25 pounds. Does intermittent fasting.   Traveled to [place] and [place] last year.  Plans to go to [place].   PCP:   Dr. Lenord Fellers  Patient's last menstrual period was 10/18/2016 (approximate).           Sexually active: Yes.    The current method of family planning is post menopausal status/vasectomy.    Exercising: Yes.     Group exercises, yoga 7x a week, walking Smoker:  no  Health Maintenance: Pap:  04/26/22 neg: HR HPV neg, 08/05/19 neg: HR HPV neg History of abnormal Pap:  yes, one way in the past, repeat pap was normal MMG:  04/18/22 Breast Density Cat B, BI-RADS CAT 1 neg Colonoscopy:  08/06/20 - due in 10 years. BMD:   n/a  Result  n/a TDaP:  03/17/21 Gardasil:   no Screening Labs:  PCP Shingrix:  completed one dose.    reports that she has never smoked. She has never used smokeless tobacco. She reports current alcohol use. She reports that she does not use drugs.  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   Endometriosis    History of COVID-19 05/2021   Hypoglycemia    Infertility, female    Migraine    occ   Mitral valve prolapse    mild no cardiologist   PMB (postmenopausal bleeding) 05/05/2022   Urinary incontinence     Past Surgical History:  Procedure Laterality Date   BARTHOLIN GLAND CYST EXCISION     drained per pt   BREAST REDUCTION SURGERY Bilateral 11/17/2022   Procedure: MAMMARY REDUCTION  (BREAST);  Surgeon: Glenna Fellows, MD;  Location: [facility];  Service: Plastics;  Laterality: Bilateral;   colonscopy  2021   DILATATION &  CURETTAGE/HYSTEROSCOPY WITH MYOSURE N/A 05/15/2022   Procedure: DILATATION & CURETTAGE/HYSTEROSCOPY;  Surgeon: Patton Salles, MD;  Location: [facility];  Service: Gynecology;  Laterality: N/A;   IVF     swdaton with egg retrieval    LAPAROSCOPIC ENDOMETRIOSIS FULGURATION  2000   PELVIC LAPAROSCOPY     WISDOM TOOTH EXTRACTION      No current outpatient medications on file.   No current facility-administered medications for this visit.    Family History  Problem Relation Age of Onset   Asthma Mother    Cancer Father 84       prostate cancer   Hyperlipidemia Father    Prostate cancer Father    Aneurysm Sister        with brain aneurysm--doing well   Cerebral palsy Daughter    Breast cancer Maternal Aunt 39       A & W--BRCA neg   Diabetes Maternal Aunt    Stroke Paternal Grandmother    Aneurysm Paternal Aunt        dec brain aneurysm   Colon cancer Neg Hx    Colon polyps Neg Hx    Esophageal cancer Neg Hx    Rectal cancer Neg Hx    Stomach  cancer Neg Hx     Review of Systems  All other systems reviewed and are negative.   Exam:   BP 128/84 (BP Location: Left Arm, Patient Position: Sitting, Cuff Size: Normal)   Pulse 90   Ht 5\' 5"  (1.651 m)   Wt 114 lb (51.7 kg)   LMP 10/18/2016 (Approximate)   SpO2 96%   BMI 18.97 kg/m     General appearance: alert, cooperative and appears stated age Head: normocephalic, without obvious abnormality, atraumatic Neck: no adenopathy, supple, symmetrical, trachea midline and thyroid normal to inspection and palpation Lungs: clear to auscultation bilaterally Breasts: consistent with bilateral breast reduction, no masses or tenderness, No nipple retraction or dimpling, No nipple discharge or bleeding, No axillary adenopathy Heart: regular rate and rhythm Abdomen: soft, non-tender; no masses, no organomegaly Extremities: extremities normal, atraumatic, no cyanosis or edema Skin: skin color, texture, turgor  normal. No rashes or lesions Lymph nodes: cervical, supraclavicular, and axillary nodes normal. Neurologic: grossly normal  Pelvic: External genitalia:  no lesions              No abnormal inguinal nodes palpated.              Urethra:  normal appearing urethra with no masses, tenderness or lesions              Bartholins and Skenes: normal                 Vagina: normal appearing vagina with normal color and discharge, no lesions              Cervix: no lesions              Pap taken: no Bimanual Exam:  Uterus:  normal size, contour, position, consistency, mobility, non-tender              Adnexa: no mass, fullness, tenderness              Rectal exam: yes.  Confirms.              Anus:  normal sphincter tone, no lesions  Chaperone was present for exam:  Ricke Hey, CMA  Assessment:   Well woman visit with gynecologic exam. Status post hysteroscopy with dilation and curettage.  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  Remote hx abnormal pap.  Status post bilateral breast reduction.   Plan: Mammogram screening discussed.  She will contact her breast surgeon to see if she can proceed with a mammogram at this time.  Self breast awareness reviewed. Pap and HR HPV 2028. Guidelines for Calcium, Vitamin D, regular exercise program including cardiovascular and weight bearing exercise. Follow up annually and prn.

## 2023-04-11 ENCOUNTER — Encounter: Payer: Self-pay | Admitting: Obstetrics and Gynecology

## 2023-04-11 ENCOUNTER — Ambulatory Visit (INDEPENDENT_AMBULATORY_CARE_PROVIDER_SITE_OTHER): Payer: BC Managed Care – PPO | Admitting: Obstetrics and Gynecology

## 2023-04-11 VITALS — BP 128/84 | HR 90 | Ht 65.0 in | Wt 114.0 lb

## 2023-04-11 DIAGNOSIS — Z01419 Encounter for gynecological examination (general) (routine) without abnormal findings: Secondary | ICD-10-CM | POA: Diagnosis not present

## 2023-04-11 NOTE — Patient Instructions (Signed)

## 2023-04-27 DIAGNOSIS — D225 Melanocytic nevi of trunk: Secondary | ICD-10-CM | POA: Diagnosis not present

## 2023-04-27 DIAGNOSIS — L821 Other seborrheic keratosis: Secondary | ICD-10-CM | POA: Diagnosis not present

## 2023-04-27 DIAGNOSIS — Z85828 Personal history of other malignant neoplasm of skin: Secondary | ICD-10-CM | POA: Diagnosis not present

## 2023-04-27 DIAGNOSIS — D2261 Melanocytic nevi of right upper limb, including shoulder: Secondary | ICD-10-CM | POA: Diagnosis not present

## 2023-05-23 ENCOUNTER — Other Ambulatory Visit: Payer: Self-pay | Admitting: Internal Medicine

## 2023-05-23 DIAGNOSIS — Z1231 Encounter for screening mammogram for malignant neoplasm of breast: Secondary | ICD-10-CM

## 2023-06-26 ENCOUNTER — Ambulatory Visit: Payer: BC Managed Care – PPO

## 2023-10-19 ENCOUNTER — Ambulatory Visit: Payer: BC Managed Care – PPO | Admitting: Internal Medicine

## 2023-10-19 ENCOUNTER — Encounter: Payer: Self-pay | Admitting: Internal Medicine

## 2023-10-19 VITALS — BP 100/60 | HR 69 | Temp 98.6°F | Ht 65.0 in | Wt 112.0 lb

## 2023-10-19 DIAGNOSIS — J029 Acute pharyngitis, unspecified: Secondary | ICD-10-CM | POA: Diagnosis not present

## 2023-10-19 LAB — POCT RAPID STREP A (OFFICE): Rapid Strep A Screen: NEGATIVE

## 2023-10-19 MED ORDER — AZITHROMYCIN 250 MG PO TABS: ORAL_TABLET | ORAL | 0 refills | Status: AC

## 2023-10-19 NOTE — Progress Notes (Signed)
Patient Care Team: Margaree Mackintosh, MD as PCP - General (Internal Medicine) Patton Salles, MD as Consulting Physician (Obstetrics and Gynecology)  Visit Date: 10/19/23  Subjective:    Patient ID: [name] , Female   DOB: 10-Feb-1971, 52 y.o.    MRN: [id number]   52 y.o. Female presents today for sore throat since 10/17/23.  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   Endometriosis    History of COVID-19 05/2021   Hypoglycemia    Infertility, female    Migraine    occ   Mitral valve prolapse    mild no cardiologist   PMB (postmenopausal bleeding) 05/05/2022   Urinary incontinence      Family History  Problem Relation Age of Onset   Asthma Mother    Cancer Father 8       prostate cancer   Hyperlipidemia Father    Prostate cancer Father    Aneurysm Sister        with brain aneurysm--doing well   Cerebral palsy Daughter    Breast cancer Maternal Aunt 50       A & W--BRCA neg   Diabetes Maternal Aunt    Stroke Paternal Grandmother    Aneurysm Paternal Aunt        dec brain aneurysm   Colon cancer Neg Hx    Colon polyps Neg Hx    Esophageal cancer Neg Hx    Rectal cancer Neg Hx    Stomach cancer Neg Hx     Social Hx: Works for [employer]. Does not smoke. Married with 2 daughters.     Review of Systems  Constitutional:  Negative for fever and malaise/fatigue.  HENT:  Positive for sore throat. Negative for congestion.   Eyes:  Negative for blurred vision.  Respiratory:  Negative for cough and shortness of breath.   Cardiovascular:  Negative for chest pain, palpitations and leg swelling.  Gastrointestinal:  Negative for vomiting.  Musculoskeletal:  Negative for back pain.  Skin:  Negative for rash.   Neurological:  Negative for loss of consciousness and headaches.        Objective:   Vitals: BP 100/60   Pulse 69   Temp 98.6 F (37 C)   Ht 5\' 5"  (1.651 m)   Wt 112 lb (50.8 kg)   LMP 10/18/2016 (Approximate)   SpO2 96%   BMI 18.64 kg/m    Physical Exam Vitals and nursing note reviewed.  Constitutional:      General: She is not in acute distress.    Appearance: Normal appearance. She is not toxic-appearing.  HENT:     Head: Normocephalic and atraumatic.     Right Ear: Hearing, tympanic membrane, ear canal and external ear normal.     Left Ear: Hearing, tympanic membrane, ear canal and external ear normal.     Mouth/Throat:     Pharynx: Oropharynx is clear. No oropharyngeal exudate.  Pulmonary:     Effort: Pulmonary effort is normal. No respiratory distress.     Breath sounds: Normal breath sounds. No wheezing or rales.  Lymphadenopathy:     Cervical: No cervical adenopathy.  Skin:    General:  Skin is warm and dry.  Neurological:     Mental Status: She is alert and oriented to person, place, and time. Mental status is at baseline.  Psychiatric:        Mood and Affect: Mood normal.        Behavior: Behavior normal.        Thought Content: Thought content normal.        Judgment: Judgment normal.      Results:   Studies obtained and personally reviewed by me:   Labs:       Component Value Date/Time   NA 138 05/11/2022 1338   K 4.0 05/11/2022 1338   CL 106 05/11/2022 1338   CO2 25 05/11/2022 1338   GLUCOSE 93 05/11/2022 1338   BUN 15 05/11/2022 1338   CREATININE 0.66 05/11/2022 1338   CREATININE 0.81 03/14/2021 1119   CALCIUM 9.2 05/11/2022 1338   PROT 6.9 03/14/2021 1119   ALBUMIN 4.6 08/03/2016 1019   AST 24 03/14/2021 1119   ALT 28 03/14/2021 1119   ALKPHOS 65 08/03/2016 1019   BILITOT 0.8 03/14/2021 1119   GFRNONAA >60 05/11/2022 1338   GFRNONAA 84 03/14/2021 1119   GFRAA 97 03/14/2021 1119     Lab Results  Component Value Date   WBC  7.9 05/11/2022   HGB 14.2 05/11/2022   HCT 43.5 05/11/2022   MCV 92.9 05/11/2022   PLT 186 05/11/2022    Lab Results  Component Value Date   CHOL 174 03/14/2021   HDL 73 03/14/2021   LDLCALC 83 03/14/2021   TRIG 86 03/14/2021   CHOLHDL 2.4 03/14/2021    Lab Results  Component Value Date   HGBA1C 5.5 07/11/2011     Lab Results  Component Value Date   TSH 2.34 03/14/2021      Assessment & Plan:   Acute non-strep pharyngitis: strep test negative. Prescribed Z-Pak two tabs day 1 followed by one tab days 2-5. Get plenty of rest and stay well-hydrated. Contact us if symptoms worsen or fail to improve.  Vaccine counseling: reports UTD on flu, Covid-19 boosters.    I,Alexander Ruley,acting as a Neurosurgeon for Margaree Mackintosh, MD.,have documented all relevant documentation on the behalf of Margaree Mackintosh, MD,as directed by  Margaree Mackintosh, MD while in the presence of Margaree Mackintosh, MD.   I, Margaree Mackintosh, MD, have reviewed all documentation for this visit. The documentation on 10/29/23 for the exam, diagnosis, procedures, and orders are all accurate and complete.

## 2023-10-19 NOTE — Patient Instructions (Signed)
Rapid strep screen is negative. Take Zithromax Z-pak 2 tabs day 1 followed by one tab days 2-5. Rest and stay well hydrated. May take Tylenol for throat pain.

## 2023-10-22 ENCOUNTER — Encounter: Payer: Self-pay | Admitting: Internal Medicine

## 2023-10-22 MED ORDER — HYDROCODONE BIT-HOMATROP MBR 5-1.5 MG/5ML PO SOLN: 5.0000 mL | Freq: Three times a day (TID) | ORAL | 0 refills | Status: DC | PRN

## 2023-10-22 NOTE — Telephone Encounter (Signed)
Sending in Hydrocodone (Hycodan) cough syrup. Finish Z-pak and see how you are in a couple of days. MJB

## 2023-12-07 ENCOUNTER — Ambulatory Visit: Payer: BC Managed Care – PPO

## 2023-12-17 ENCOUNTER — Other Ambulatory Visit: Payer: BC Managed Care – PPO

## 2023-12-17 DIAGNOSIS — Z13220 Encounter for screening for lipoid disorders: Secondary | ICD-10-CM | POA: Diagnosis not present

## 2023-12-17 DIAGNOSIS — Z1329 Encounter for screening for other suspected endocrine disorder: Secondary | ICD-10-CM

## 2023-12-17 DIAGNOSIS — E162 Hypoglycemia, unspecified: Secondary | ICD-10-CM

## 2023-12-17 DIAGNOSIS — Z0000 Encounter for general adult medical examination without abnormal findings: Secondary | ICD-10-CM

## 2023-12-18 ENCOUNTER — Ambulatory Visit (INDEPENDENT_AMBULATORY_CARE_PROVIDER_SITE_OTHER): Payer: BC Managed Care – PPO | Admitting: Internal Medicine

## 2023-12-18 ENCOUNTER — Encounter: Payer: Self-pay | Admitting: Internal Medicine

## 2023-12-18 VITALS — BP 102/70 | HR 60 | Ht 65.0 in | Wt 118.0 lb

## 2023-12-18 DIAGNOSIS — Z136 Encounter for screening for cardiovascular disorders: Secondary | ICD-10-CM

## 2023-12-18 DIAGNOSIS — Z780 Asymptomatic menopausal state: Secondary | ICD-10-CM | POA: Diagnosis not present

## 2023-12-18 DIAGNOSIS — Z0000 Encounter for general adult medical examination without abnormal findings: Secondary | ICD-10-CM | POA: Diagnosis not present

## 2023-12-18 LAB — LIPID PANEL
Cholesterol: 192 mg/dL (ref <200)
HDL: 79 mg/dL (ref >=50)
LDL Cholesterol (Calc): 96 mg/dL
Non-HDL Cholesterol (Calc): 113 mg/dL (ref <130)
Total CHOL/HDL Ratio: 2.4 (calc) (ref <5.0)
Triglycerides: 83 mg/dL (ref <150)

## 2023-12-18 LAB — TSH: TSH: 1.7 m[IU]/L

## 2023-12-18 LAB — COMPLETE METABOLIC PANEL WITH GFR
AG Ratio: 2.4 (calc) (ref 1.0–2.5)
ALT: 31 U/L — ABNORMAL HIGH (ref 6–29)
AST: 30 U/L (ref 10–35)
Albumin: 4.6 g/dL (ref 3.6–5.1)
Alkaline phosphatase (APISO): 83 U/L (ref 37–153)
BUN: 12 mg/dL (ref 7–25)
CO2: 26 mmol/L (ref 20–32)
Calcium: 9.8 mg/dL (ref 8.6–10.4)
Chloride: 102 mmol/L (ref 98–110)
Creat: 0.75 mg/dL (ref 0.50–1.03)
Globulin: 1.9 g/dL (ref 1.9–3.7)
Glucose, Bld: 87 mg/dL (ref 65–99)
Potassium: 5 mmol/L (ref 3.5–5.3)
Sodium: 137 mmol/L (ref 135–146)
Total Bilirubin: 0.7 mg/dL (ref 0.2–1.2)
Total Protein: 6.5 g/dL (ref 6.1–8.1)
eGFR: 95 mL/min/{1.73_m2} (ref >=60)

## 2023-12-18 LAB — CBC WITH DIFFERENTIAL/PLATELET
Absolute Lymphocytes: 1513 {cells}/uL (ref 850–3900)
Absolute Monocytes: 328 {cells}/uL (ref 200–950)
Basophils Absolute: 21 {cells}/uL (ref 0–200)
Basophils Relative: 0.4 %
Eosinophils Absolute: 68 {cells}/uL (ref 15–500)
Eosinophils Relative: 1.3 %
HCT: 42.3 % (ref 35.0–45.0)
Hemoglobin: 13.8 g/dL (ref 11.7–15.5)
MCH: 30.3 pg (ref 27.0–33.0)
MCHC: 32.6 g/dL (ref 32.0–36.0)
MCV: 92.8 fL (ref 80.0–100.0)
MPV: 11 fL (ref 7.5–12.5)
Monocytes Relative: 6.3 %
Neutro Abs: 3271 {cells}/uL (ref 1500–7800)
Neutrophils Relative %: 62.9 %
Platelets: 179 10*3/uL (ref 140–400)
RBC: 4.56 10*6/uL (ref 3.80–5.10)
RDW: 12.5 % (ref 11.0–15.0)
Total Lymphocyte: 29.1 %
WBC: 5.2 10*3/uL (ref 3.8–10.8)
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
## 2023-12-21 ENCOUNTER — Ambulatory Visit: Payer: BC Managed Care – PPO

## 2024-01-04 ENCOUNTER — Ambulatory Visit
Admission: RE | Admit: 2024-01-04 | Discharge: 2024-01-04 | Disposition: A | Discharge status: Home / Self Care | Payer: BC Managed Care – PPO | Source: Ambulatory Visit | Attending: Internal Medicine | Admitting: Internal Medicine

## 2024-01-04 ENCOUNTER — Encounter: Payer: Self-pay | Admitting: Internal Medicine

## 2024-01-04 DIAGNOSIS — Z1231 Encounter for screening mammogram for malignant neoplasm of breast: Secondary | ICD-10-CM

## 2024-01-04 NOTE — Patient Instructions (Addendum)
 Coronary calcium scoring discussed due to family history hyperlipidemia in father.  Order placed.  Bone density study ordered.  Colonoscopy is up-to-date.  Vaccines reviewed.  Mammogram up-to-date.  Labs reviewed and are within normal limits.  It was a pleasure to see you today.

## 2024-01-11 ENCOUNTER — Ambulatory Visit (HOSPITAL_COMMUNITY)
Admission: RE | Admit: 2024-01-11 | Discharge: 2024-01-11 | Disposition: A | Discharge status: Home / Self Care | Payer: Self-pay | Source: Ambulatory Visit | Attending: Internal Medicine | Admitting: Internal Medicine

## 2024-01-11 DIAGNOSIS — Z136 Encounter for screening for cardiovascular disorders: Secondary | ICD-10-CM | POA: Insufficient documentation

## 2024-01-14 ENCOUNTER — Telehealth: Payer: Self-pay

## 2024-01-14 NOTE — Telephone Encounter (Signed)
 Patient questioning her liver enzymes she said her "scores were off in my labs".

## 2024-01-15 ENCOUNTER — Telehealth: Payer: Self-pay | Admitting: Internal Medicine

## 2024-01-15 ENCOUNTER — Ambulatory Visit: Payer: BC Managed Care – PPO | Admitting: Internal Medicine

## 2024-01-15 ENCOUNTER — Ambulatory Visit (INDEPENDENT_AMBULATORY_CARE_PROVIDER_SITE_OTHER): Payer: BC Managed Care – PPO

## 2024-01-15 VITALS — BP 110/70 | HR 68 | Ht 65.0 in | Wt 118.0 lb

## 2024-01-15 DIAGNOSIS — Z23 Encounter for immunization: Secondary | ICD-10-CM | POA: Diagnosis not present

## 2024-01-15 DIAGNOSIS — Z7184 Encounter for health counseling related to travel: Secondary | ICD-10-CM | POA: Diagnosis not present

## 2024-01-15 MED ORDER — AZITHROMYCIN 250 MG PO TABS: ORAL_TABLET | ORAL | 0 refills | Status: AC

## 2024-01-15 MED ORDER — ONDANSETRON HCL 4 MG PO TABS: 4.0000 mg | ORAL_TABLET | Freq: Three times a day (TID) | ORAL | 0 refills | Status: AC | PRN

## 2024-01-15 NOTE — Progress Notes (Addendum)
Patient Care Team: Margaree Mackintosh, MD as PCP - General (Internal Medicine) Patton Salles, MD as Consulting Physician (Obstetrics and Gynecology)  Visit Date: 01/15/24  Subjective:   Chief Complaint  Patient presents with   Travel advice   Patient WU:JWJXB H Siebels,Female DOB:[birth year],52 y.o. JYN:[id number]   53 y.o. Female presents today for acute visit with Travel Advice. Patient has a past medical history of Motion Sickness. She reports that upcoming she will be travelling to [place], and would like some anti-vertigo medicine. Notes that she has purchased an anti-motion sickness band and some OTC Dramamine, but just wanted to be sure that she had all her bases covered.    Also mentions her 01/11/24 CT Coronary Calcium Scoring. Expressed concern over the coarse calcification in subcapsular right lobe of liver, as she doesn't regularly binge drink or drink in general.  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   Endometriosis    History of COVID-19 05/2021   Hypoglycemia    Infertility, female    Migraine    occ   Mitral valve prolapse    mild no cardiologist   PMB (postmenopausal bleeding) 05/05/2022   Urinary incontinence     Allergies  Allergen Reactions   Sulfa Antibiotics Rash    Family History  Problem Relation Age of Onset   Asthma Mother    Cancer Father 58       prostate cancer   Hyperlipidemia Father    Prostate cancer Father    Aneurysm Sister        with brain aneurysm--doing well   Cerebral palsy Daughter    Breast cancer Maternal Aunt 27       A & W--BRCA neg   Diabetes Maternal Aunt    Stroke Paternal Grandmother    Aneurysm Paternal Aunt        dec brain aneurysm   Colon cancer Neg Hx    Colon polyps Neg Hx     Esophageal cancer Neg Hx    Rectal cancer Neg Hx    Stomach cancer Neg Hx    Social Hx: Married.seldom consumes alcohol.2 daughters.does not smoke.Has worked in [employer] Graduate of [employer].  Review of Systems  All other systems reviewed and are negative.    Objective:  Vitals: BP 110/70   Pulse 68   Ht 5\' 5"  (1.651 m)   Wt 118 lb (53.5 kg)   LMP 10/18/2016 (Approximate)   SpO2 98%   BMI 19.64 kg/m   Physical Exam Vitals and nursing note reviewed.  Constitutional:      General: She is not in acute distress.    Appearance: Normal appearance. She is not toxic-appearing.  HENT:     Head: Normocephalic and atraumatic.  Pulmonary:     Effort: Pulmonary effort is normal.  Skin:    General: Skin is warm and dry.  Neurological:     Mental Status: She is alert and oriented to person, place, and time. Mental status is at baseline.  Psychiatric:        Mood and Affect: Mood normal.        Behavior: Behavior normal.        Thought  Content: Thought content normal.        Judgment: Judgment normal.     Results:  Studies Obtained And Personally Reviewed By Me:  Coronary Calcium Score 01/11/24 16:30   FINDINGS: Coronary arteries: Normal origins.   Coronary Calcium Score:   Left main: 0   Left anterior descending artery: 0   Left circumflex artery: 0   Right coronary artery: 0   Total: 0   Percentile: 0   Pericardium: Normal.   Aorta: Normal caliber of ascending aorta. Scattered aortic atherosclerosis noted.  IMPRESSION: Coronary calcium score of 0. This was 0 percentile for age-, race-, and sex-matched controls. Scattered aortic atherosclerosis noted.   Non-cardiac: OVER-READ INTERPRETATION CT CHEST  FINDINGS: Vascular: The included aorta is normal in caliber.   Mediastinum/nodes: No adenopathy or mass. Unremarkable esophagus.   Lungs: No focal airspace disease. No pulmonary nodule. No pleural fluid. The included airways are patent.    Upper abdomen: No acute findings. Coarse calcification in the subcapsular right lobe of the liver, typically benign needing no further imaging follow-up.   Musculoskeletal: There are no acute or suspicious osseous abnormalities.   IMPRESSION: No acute or unexpected extracardiac findings.   Labs:     Component Value Date/Time   NA 137 12/17/2023 1149   K 5.0 12/17/2023 1149   CL 102 12/17/2023 1149   CO2 26 12/17/2023 1149   GLUCOSE 87 12/17/2023 1149   BUN 12 12/17/2023 1149   CREATININE 0.75 12/17/2023 1149   CALCIUM 9.8 12/17/2023 1149   PROT 6.5 12/17/2023 1149   ALBUMIN 4.6 08/03/2016 1019   AST 30 12/17/2023 1149   ALT 31 (H) 12/17/2023 1149   ALKPHOS 65 08/03/2016 1019   BILITOT 0.7 12/17/2023 1149   GFRNONAA >60 05/11/2022 1338   GFRNONAA 84 03/14/2021 1119   GFRAA 97 03/14/2021 1119    Lab Results  Component Value Date   WBC 5.2 12/17/2023   HGB 13.8 12/17/2023   HCT 42.3 12/17/2023   MCV 92.8 12/17/2023   PLT 179 12/17/2023   Lab Results  Component Value Date   CHOL 192 12/17/2023   HDL 79 12/17/2023   LDLCALC 96 12/17/2023   TRIG 83 12/17/2023   CHOLHDL 2.4 12/17/2023   Lab Results  Component Value Date   HGBA1C 5.5 07/11/2011    Lab Results  Component Value Date   TSH 1.70 12/17/2023    Assessment & Plan:   Travel Advice: sending in 4 mg Zofran 250 - take 1 tablet (4 mg total) by mouth every 8 (eight) hours as needed for nausea or vomiting. In the case she contracts an acute illness, sending in 250 mg Azithromycin preemptively - take 2 tablets on Day 1 and 1 tablet on Days 2-5      I,Bettina Lagle,acting as a scribe for Margaree Mackintosh, MD.,have documented all relevant documentation on the behalf of Margaree Mackintosh, MD,as directed by  Margaree Mackintosh, MD while in the presence of Margaree Mackintosh, MD.   I, Margaree Mackintosh, MD, have reviewed all documentation for this visit. The documentation on 01/23/24 for the exam, diagnosis, procedures, and orders are  all accurate and complete.

## 2024-01-15 NOTE — Progress Notes (Signed)
Patient here for pneumococcal 20 vaccine. MJB, MD

## 2024-01-15 NOTE — Telephone Encounter (Signed)
[name] is here for her PNA shot and she wanted to see if she could have an appointment to talk to you about some travel medication.

## 2024-01-15 NOTE — Telephone Encounter (Signed)
scheduled

## 2024-01-15 NOTE — Progress Notes (Signed)
Patient presents for a pneumonia 20 vaccine. Patient received a pneumonia 20 vaccine left deltoid, patient tolerated well.

## 2024-01-23 ENCOUNTER — Encounter: Payer: Self-pay | Admitting: Internal Medicine

## 2024-01-23 NOTE — Patient Instructions (Addendum)
IT WAS A PLEASURE TO SEE YOU TODAY. TRAVEL ADVICE AND PRESCRIPTIONS SENT IN FOR TRIP TO [place].

## 2024-01-28 DIAGNOSIS — M25532 Pain in left wrist: Secondary | ICD-10-CM | POA: Diagnosis not present

## 2024-02-05 DIAGNOSIS — S52515A Nondisplaced fracture of left radial styloid process, initial encounter for closed fracture: Secondary | ICD-10-CM | POA: Diagnosis not present

## 2024-02-05 DIAGNOSIS — G5622 Lesion of ulnar nerve, left upper limb: Secondary | ICD-10-CM | POA: Diagnosis not present

## 2024-02-05 DIAGNOSIS — G5602 Carpal tunnel syndrome, left upper limb: Secondary | ICD-10-CM | POA: Diagnosis not present

## 2024-02-07 DIAGNOSIS — L905 Scar conditions and fibrosis of skin: Secondary | ICD-10-CM | POA: Diagnosis not present

## 2024-07-04 ENCOUNTER — Ambulatory Visit: Admitting: Internal Medicine

## 2024-07-04 ENCOUNTER — Encounter: Payer: Self-pay | Admitting: Internal Medicine

## 2024-07-04 VITALS — BP 110/80 | HR 52 | Ht 65.0 in | Wt 115.0 lb

## 2024-07-04 DIAGNOSIS — R7401 Elevation of levels of liver transaminase levels: Secondary | ICD-10-CM

## 2024-07-04 DIAGNOSIS — J329 Chronic sinusitis, unspecified: Secondary | ICD-10-CM

## 2024-07-04 MED ORDER — METHYLPREDNISOLONE ACETATE 80 MG/ML IJ SUSP
80.0000 mg | Freq: Once | INTRAMUSCULAR | Status: AC
  Administered 2024-07-04: 80 mg via INTRAMUSCULAR

## 2024-07-04 MED ORDER — AZITHROMYCIN 250 MG PO TABS: ORAL_TABLET | ORAL | 0 refills | Status: AC

## 2024-07-04 NOTE — Patient Instructions (Addendum)
 We are sorry you are not feeling well. Sending in Zithromax  Z pak to take 2 tabs day 1 followed by 1 tab days 2-5. Depomedrol 80 mg Im given for congestion. Patient requests followed up on ALT measurement which was slightly elevated at 31 in January. This was done today.

## 2024-07-04 NOTE — Progress Notes (Signed)
 Patient Care Team: Sandra Ronal PARAS, MD as PCP - General (Internal Medicine) Sandra JAYSON Nikki Bobie FORBES, MD as Consulting Physician (Obstetrics and Gynecology)  Visit Date: 07/04/24  Subjective:   Chief Complaint  Patient presents with   Facial Pain    Right cheek pain and right ear pain.    [name],Female DOB:Jan 13, 1971,52 y.o. MRN:[id number]   52 y.o.Female presents today for acute sick visit with Facial Pain - Right Cheek, Right Ear. Patient has a past medical history of Allergy; Hypoglycemia; Migraines. Says that she recently started to have right cheek pain with occasional ear pain, which she initially saw the dentist for as she did have a crown replaced about a month ago and thought it may be related, but was told that everything was normal in that regard. Notes her daughter also had some similar symptoms, but hers resolved over the weekend while [name] is persisting. Denies fever/chills or sore throat, but does have some mild congestion requiring her to blow her nose occasionally. For relief, she says she has been taking Ibuprofen  and Tylenol , but these did not help last night as she could not fall asleep due to the pain.  Discussed elevated ALT of 31 in January of this year. 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   Endometriosis    History of COVID-19 05/2021   Hypoglycemia    Infertility, female    Migraine    occ   Mitral valve prolapse    mild no cardiologist   PMB (postmenopausal bleeding) 05/05/2022   Urinary incontinence     Allergies  Allergen Reactions   Sulfa Antibiotics Rash    Family History  Problem Relation Age of Onset   Asthma Mother    Cancer Father 37       prostate cancer   Hyperlipidemia Father     Prostate cancer Father    Aneurysm Sister        with brain aneurysm--doing well   Cerebral palsy Daughter    Breast cancer Maternal Aunt 12       A & W--BRCA neg   Diabetes Maternal Aunt    Stroke Paternal Grandmother    Aneurysm Paternal Aunt        dec brain aneurysm   Colon cancer Neg Hx    Colon polyps Neg Hx    Esophageal cancer Neg Hx    Rectal cancer Neg Hx    Stomach cancer Neg Hx    Social Hx:  Review of Systems  Constitutional:  Negative for chills and fever.  HENT:  Positive for congestion and ear pain (Right). Negative for sore throat.        (+) Facial Pain, Right-sided only  All other systems reviewed and are negative.    Objective:  Vitals: BP 110/80   Pulse (!) 52   Ht 5' 5 (1.651 m)   Wt 115 lb (52.2 kg)   LMP 10/18/2016 (Approximate)   SpO2 99%   BMI 19.14 kg/m   Physical Exam Vitals and nursing note reviewed.  Constitutional:      General: She is not in acute distress.    Appearance: Normal appearance. She is not ill-appearing.  HENT:  Head: Normocephalic and atraumatic.     Comments: Frontal Sinuses normal on visualization via transillumination    Right Ear: Ear canal and external ear normal. Tympanic membrane is injected. Tympanic membrane is not erythematous.     Left Ear: Hearing, tympanic membrane, ear canal and external ear normal. Tympanic membrane is not injected or erythematous.     Mouth/Throat:     Mouth: Mucous membranes are moist.     Pharynx: Posterior oropharyngeal erythema (slight) present. No oropharyngeal exudate.  Pulmonary:     Effort: Pulmonary effort is normal.     Breath sounds: Normal breath sounds. No wheezing, rhonchi or rales.  Lymphadenopathy:     Cervical: No cervical adenopathy.  Skin:    General: Skin is warm and dry.  Neurological:     Mental Status: She is alert and oriented to person, place, and time. Mental status is at baseline.  Psychiatric:        Mood and Affect: Mood normal.        Behavior:  Behavior normal.        Thought Content: Thought content normal.        Judgment: Judgment normal.     Results:  Studies Obtained And Personally Reviewed By Me: Labs:     Component Value Date/Time   NA 137 12/17/2023 1149   K 5.0 12/17/2023 1149   CL 102 12/17/2023 1149   CO2 26 12/17/2023 1149   GLUCOSE 87 12/17/2023 1149   BUN 12 12/17/2023 1149   CREATININE 0.75 12/17/2023 1149   CALCIUM 9.8 12/17/2023 1149   PROT 6.5 12/17/2023 1149   ALBUMIN 4.6 08/03/2016 1019   AST 30 12/17/2023 1149   ALT 31 (H) 12/17/2023 1149   ALKPHOS 65 08/03/2016 1019   BILITOT 0.7 12/17/2023 1149   GFRNONAA >60 05/11/2022 1338   GFRNONAA 84 03/14/2021 1119   GFRAA 97 03/14/2021 1119    Lab Results  Component Value Date   WBC 5.2 12/17/2023   HGB 13.8 12/17/2023   HCT 42.3 12/17/2023   MCV 92.8 12/17/2023   PLT 179 12/17/2023   Lab Results  Component Value Date   CHOL 192 12/17/2023   HDL 79 12/17/2023   LDLCALC 96 12/17/2023   TRIG 83 12/17/2023   CHOLHDL 2.4 12/17/2023   Lab Results  Component Value Date   HGBA1C 5.5 07/11/2011    Lab Results  Component Value Date   TSH 1.70 12/17/2023    Assessment & Plan:   Orders Placed This Encounter  Procedures   Hepatic function panel  Patient asked to repeat this due to elevation (very mild) ALT of 31 in January 2025 Meds ordered this encounter  Medications   azithromycin  (ZITHROMAX ) 250 MG tablet    Sig: Take 2 tablets on day 1, then 1 tablet daily on days 2 through 5    Dispense:  6 tablet    Refill:  0   methylPREDNISolone  acetate (DEPO-MEDROL ) injection 80 mg   Sinusitis: recently started to have right cheek pain with occasional ear pain, which she initially saw the dentist for as she did have a crown replaced about a month ago and thought it may be related, but was told that everything was normal in that regard. Daughter had some similar symptoms, but hers resolved over the weekend while [name]'s have persisted. No  fever/chills or sore throat, but has been having mild congestion requiring her to blow her nose occasionally. For relief, she says she has been taking Ibuprofen  and  Tylenol , but these did not help last night as she could not fall asleep due to the pain. Given Depo-Medrol  80 mg IM today. Sending in Azithromycin  250 mg - take 2 tablets on Day 1 and 1 tablet on Days 2-5. Contact us  if symptoms worsen/persist despite treatment.   Elevated ALT Measurement: discussed elevated ALT of 31 in January of this year . I am not concerned about this slight elevation, she would like to have this rechecked regardless. Ordering Hepatic Function Panel, results pending.      I,Juliauna Lagle,acting as a Neurosurgeon for Ronal JINNY Hailstone, MD.,have documented all relevant documentation on the behalf of Ronal JINNY Hailstone, MD,as directed by  Ronal JINNY Hailstone, MD while in the presence of Ronal JINNY Hailstone, MD.   I, Ronal JINNY Hailstone, MD, have reviewed all documentation for this visit. The documentation on 07/04/2024 for the exam, diagnosis, procedures, and orders are all accurate and complete.

## 2024-07-05 ENCOUNTER — Ambulatory Visit: Payer: Self-pay | Admitting: Internal Medicine

## 2024-07-07 ENCOUNTER — Other Ambulatory Visit: Payer: Self-pay

## 2024-07-07 DIAGNOSIS — R7401 Elevation of levels of liver transaminase levels: Secondary | ICD-10-CM

## 2024-07-07 DIAGNOSIS — R7989 Other specified abnormal findings of blood chemistry: Secondary | ICD-10-CM

## 2024-07-08 LAB — TEST AUTHORIZATION

## 2024-07-08 LAB — HEPATIC FUNCTION PANEL
AG Ratio: 2.7 (calc) — ABNORMAL HIGH (ref 1.0–2.5)
ALT: 94 U/L — ABNORMAL HIGH (ref 6–29)
AST: 53 U/L — ABNORMAL HIGH (ref 10–35)
Albumin: 4.6 g/dL (ref 3.6–5.1)
Alkaline phosphatase (APISO): 81 U/L (ref 37–153)
Bilirubin, Direct: 0.1 mg/dL (ref 0.0–0.2)
Globulin: 1.7 g/dL — ABNORMAL LOW (ref 1.9–3.7)
Indirect Bilirubin: 0.5 mg/dL (ref 0.2–1.2)
Total Bilirubin: 0.6 mg/dL (ref 0.2–1.2)
Total Protein: 6.3 g/dL (ref 6.1–8.1)

## 2024-07-08 LAB — GAMMA GT: GGT: 54 U/L (ref 3–70)

## 2024-07-09 ENCOUNTER — Other Ambulatory Visit

## 2024-07-14 ENCOUNTER — Ambulatory Visit
Admission: RE | Admit: 2024-07-14 | Discharge: 2024-07-14 | Disposition: A | Discharge status: Home / Self Care | Source: Ambulatory Visit | Attending: Internal Medicine | Admitting: Internal Medicine

## 2024-07-14 ENCOUNTER — Other Ambulatory Visit

## 2024-07-14 ENCOUNTER — Ambulatory Visit: Payer: Self-pay | Admitting: Internal Medicine

## 2024-07-14 DIAGNOSIS — R7989 Other specified abnormal findings of blood chemistry: Secondary | ICD-10-CM | POA: Diagnosis not present

## 2024-07-14 LAB — HEPATIC FUNCTION PANEL
AG Ratio: 2.5 (calc) (ref 1.0–2.5)
ALT: 42 U/L — ABNORMAL HIGH (ref 6–29)
AST: 32 U/L (ref 10–35)
Albumin: 4.9 g/dL (ref 3.6–5.1)
Alkaline phosphatase (APISO): 88 U/L (ref 37–153)
Bilirubin, Direct: 0.1 mg/dL (ref 0.0–0.2)
Globulin: 2 g/dL (ref 1.9–3.7)
Indirect Bilirubin: 0.5 mg/dL (ref 0.2–1.2)
Total Bilirubin: 0.6 mg/dL (ref 0.2–1.2)
Total Protein: 6.9 g/dL (ref 6.1–8.1)

## 2024-07-14 NOTE — Progress Notes (Shared)
 Patient Care Team: Perri Ronal PARAS, MD as PCP - General (Internal Medicine) Cathlyn JAYSON Nikki Bobie FORBES, MD as Consulting Physician (Obstetrics and Gynecology)  Visit Date: 07/15/24  Subjective:   Chief Complaint  Patient presents with   discuss labs    Patient PI:Zfpob H Grupe,Female DOB:25-Aug-1971,53 y.o. FMW:[id number]   53 y.o.Female presents today for acute visit with Elevated LFTs. Patient has a past medical history of Anemia. On 8/01, she mentioned wanting to have a repeat ALT due to her abnormal result at the beginning of this year. Subsequently 07/04/2024 Hepatic Function w/ AST 53 and, elevated from 31 in January 2025, ALT of 94; GGT WNL at 54. 8/11 Hepatic Function: WNL except ALT 42; Limited Abdominal US  07/14/2024 normal. She is very active, reportedly exercises daily for a couple of hours with various routines of bar class, walking, weight training, and yoga. Additionally, she mentions that regarding those initial labs she'd had a sinus infection, which she was managing symptoms with Tylenol  and Aleve. 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   Endometriosis    History of COVID-19 05/2021   Hypoglycemia    Infertility, female    Migraine    occ   Mitral valve prolapse    mild no cardiologist   PMB (postmenopausal bleeding) 05/05/2022   Urinary incontinence     Allergies  Allergen Reactions   Sulfa Antibiotics Rash   Immunization History  Administered Date(s) Administered   Influenza Split 08/28/2011   Influenza,inj,Quad PF,6+ Mos 09/12/2016, 08/05/2019   Influenza,inj,quad, With Preservative 09/03/2018   Influenza-Unspecified 09/12/2016, 10/10/2021, 08/31/2023   PFIZER Comirnaty(Gray Top)Covid-19 Tri-Sucrose Vaccine 12/29/2019, 01/19/2020,  09/12/2020   PNEUMOCOCCAL CONJUGATE-20 01/15/2024   Pfizer Covid-19 Vaccine Bivalent Booster 31yrs & up 08/31/2023   Tdap 08/28/2011, 03/17/2021   Past Surgical History:  Procedure Laterality Date   BARTHOLIN GLAND CYST EXCISION     drained per pt   BREAST REDUCTION SURGERY Bilateral 11/17/2022   Procedure: MAMMARY REDUCTION  (BREAST);  Surgeon: Arelia Filippo, MD;  Location: [facility];  Service: Plastics;  Laterality: Bilateral;   colonscopy  2021   DILATATION & CURETTAGE/HYSTEROSCOPY WITH MYOSURE N/A 05/15/2022   Procedure: DILATATION & CURETTAGE/HYSTEROSCOPY;  Surgeon: Cathlyn JAYSON Nikki Bobie FORBES, MD;  Location: [facility];  Service: Gynecology;  Laterality: N/A;   IVF     swdaton with egg retrieval    LAPAROSCOPIC ENDOMETRIOSIS FULGURATION  2000   PELVIC LAPAROSCOPY     REDUCTION MAMMAPLASTY Bilateral 11/03/2022   WISDOM TOOTH EXTRACTION      Family History  Problem Relation Age of Onset   Asthma Mother    Cancer Father 12       prostate cancer   Hyperlipidemia Father    Prostate cancer Father    Aneurysm Sister        with brain aneurysm--doing well   Cerebral palsy Daughter    Breast cancer Maternal Aunt 42       A & W--BRCA neg   Diabetes Maternal Aunt    Stroke Paternal Grandmother    Aneurysm Paternal Aunt        dec brain aneurysm   Colon cancer Neg Hx    Colon  polyps Neg Hx    Esophageal cancer Neg Hx    Rectal cancer Neg Hx    Stomach cancer Neg Hx    Social Hx: married. Graduate of [employer]. 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 Does not smoke and seldom consumes alcohol. 2 daughters.  Review of Systems  Constitutional:  Negative for fever and malaise/fatigue.  HENT:  Negative for congestion.   Eyes:  Negative for blurred vision.  Respiratory:  Negative for cough and shortness of breath.   Cardiovascular:  Negative for chest pain, palpitations and leg swelling.  Gastrointestinal:  Negative for  vomiting.  Musculoskeletal:  Negative for back pain.  Skin:  Negative for rash.  Neurological:  Negative for loss of consciousness and headaches.     Objective:  Vitals: BP 100/60   Pulse 64   Ht 5' 5 (1.651 m)   Wt 118 lb (53.5 kg)   LMP 10/18/2016 (Approximate)   SpO2 97%   BMI 19.64 kg/m   Physical Exam Vitals and nursing note reviewed.  Constitutional:      General: She is not in acute distress.    Appearance: Normal appearance. She is not toxic-appearing.  HENT:     Head: Normocephalic and atraumatic.  Pulmonary:     Effort: Pulmonary effort is normal.  Skin:    General: Skin is warm and dry.  Neurological:     Mental Status: She is alert and oriented to person, place, and time. Mental status is at baseline.  Psychiatric:        Mood and Affect: Mood normal.        Behavior: Behavior normal.        Thought Content: Thought content normal.        Judgment: Judgment normal.     Results:  Studies Obtained And Personally Reviewed By Me:  US  ABDOMEN LIMITED RUQ (LIVER/GB) 07/14/2024 FINDINGS:  Gallbladder:  Gallstones: None  Sludge: None  Gallbladder Wall: Within normal limits  Pericholecystic fluid: None  Sonographic Murphy's Sign: Negative per technologist  Common bile duct: Diameter: 3 mm  Liver:  Parenchymal echogenicity: Within normal limits  Contours: Normal  Lesions: None  Portal vein: Patent.  Hepatopetal flow  Other: None.  IMPRESSION: No significant sonographic abnormality of the liver or gallbladder.   Labs:  CBC w/ Differential Lab Results  Component Value Date   WBC 5.2 12/17/2023   RBC 4.56 12/17/2023   HGB 13.8 12/17/2023   HCT 42.3 12/17/2023   PLT 179 12/17/2023   MCV 92.8 12/17/2023   MCH 30.3 12/17/2023   MCHC 32.6 12/17/2023   RDW 12.5 12/17/2023   MPV 11.0 12/17/2023   LYMPHSABS 2,057 03/14/2021   MONOABS 285 08/03/2016   BASOSABS 21 12/17/2023    Comprehensive Metabolic Panel Lab Results  Component Value Date   NA 137  12/17/2023   K 5.0 12/17/2023   CL 102 12/17/2023   CO2 26 12/17/2023   GLUCOSE 87 12/17/2023   BUN 12 12/17/2023   CREATININE 0.75 12/17/2023   CALCIUM 9.8 12/17/2023   PROT 6.9 07/14/2024   ALBUMIN 4.6 08/03/2016   AST 32 07/14/2024   ALT 42 (H) 07/14/2024   ALKPHOS 65 08/03/2016   BILITOT 0.6 07/14/2024   EGFR 95 12/17/2023   GFRNONAA >60 05/11/2022   Lipid Panel  Lab Results  Component Value Date   CHOL 192 12/17/2023   HDL 79 12/17/2023   LDLCALC 96 12/17/2023   TRIG 83 12/17/2023   A1c Lab Results  Component Value Date   HGBA1C 5.5 07/11/2011    TSH Lab Results  Component Value Date   TSH 1.70 12/17/2023   Hepatic Function Panel    Latest Ref Rng & Units 07/14/2024   10:30 AM 07/04/2024   12:03 PM  Hepatic Function  Total Protein 6.1 - 8.1 g/dL 6.9  6.3   AST 10 - 35 U/L 32  53   ALT 6 - 29 U/L 42  94   Total Bilirubin 0.2 - 1.2 mg/dL 0.6  0.6   Bilirubin, Direct 0.0 - 0.2 mg/dL 0.1  0.1    Gamma GT GGT     3 - 70 U/L 07/04/2024   54    Assessment & Plan:   Elevated LFTs: on repeat ALT 07/04/2024 Hepatic Function w/ AST 53 and, elevated from 31 in January 2025, ALT of 94; GGT WNL at 54. 8/11 Hepatic Function: WNL except ALT 42; Limited Abdominal US  07/14/2024 normal. She is very active, reportedly exercises daily for a couple of hours with various routines of bar class, walking, weight training, and yoga. Additionally, regarding those initial labs she'd had a sinus infection, which she was managing symptoms with Tylenol  and Aleve, possibly contributing to elevations.  Suspect her elevated  were likely due to exercise and perhaps Tylenol  and NSAID use although that use was not heavy.  Be careful with heavy exercise and stay well hydrated.   CPE booked for January 2026.   I,Tresha Lagle,acting as a Neurosurgeon for Ronal JINNY Hailstone, MD.,have documented all relevant documentation on the behalf of Ronal JINNY Hailstone, MD,as directed by  Ronal JINNY Hailstone, MD while in the presence of  Ronal JINNY Hailstone, MD.  I, Ronal JINNY Hailstone, MD, have reviewed all documentation for this visit. The documentation on 07/15/2024 for the exam, diagnosis, procedures, and orders are all accurate and complete.

## 2024-07-15 ENCOUNTER — Ambulatory Visit: Admitting: Internal Medicine

## 2024-07-15 ENCOUNTER — Ambulatory Visit: Payer: Self-pay | Admitting: Internal Medicine

## 2024-07-15 VITALS — BP 100/60 | HR 64 | Ht 65.0 in | Wt 118.0 lb

## 2024-07-15 DIAGNOSIS — R7989 Other specified abnormal findings of blood chemistry: Secondary | ICD-10-CM

## 2024-07-23 ENCOUNTER — Encounter: Payer: Self-pay | Admitting: Internal Medicine

## 2024-07-23 NOTE — Patient Instructions (Addendum)
 Watch amount of heavy exercise you are participating in. Stay well hydrated. Return  for labs and CPE January 2026.

## 2024-07-29 DIAGNOSIS — D225 Melanocytic nevi of trunk: Secondary | ICD-10-CM | POA: Diagnosis not present

## 2024-07-29 DIAGNOSIS — Z85828 Personal history of other malignant neoplasm of skin: Secondary | ICD-10-CM | POA: Diagnosis not present

## 2024-07-29 DIAGNOSIS — L821 Other seborrheic keratosis: Secondary | ICD-10-CM | POA: Diagnosis not present

## 2024-07-29 DIAGNOSIS — D2261 Melanocytic nevi of right upper limb, including shoulder: Secondary | ICD-10-CM | POA: Diagnosis not present

## 2024-08-15 ENCOUNTER — Other Ambulatory Visit: Payer: BC Managed Care – PPO

## 2024-09-05 ENCOUNTER — Encounter: Payer: Self-pay | Admitting: Internal Medicine

## 2024-10-29 ENCOUNTER — Ambulatory Visit (HOSPITAL_BASED_OUTPATIENT_CLINIC_OR_DEPARTMENT_OTHER)
Admission: RE | Admit: 2024-10-29 | Discharge: 2024-10-29 | Disposition: A | Discharge status: Home / Self Care | Source: Ambulatory Visit | Attending: Internal Medicine | Admitting: Internal Medicine

## 2024-10-29 DIAGNOSIS — Z780 Asymptomatic menopausal state: Secondary | ICD-10-CM | POA: Insufficient documentation

## 2024-10-29 DIAGNOSIS — M810 Age-related osteoporosis without current pathological fracture: Secondary | ICD-10-CM | POA: Diagnosis not present

## 2024-11-04 ENCOUNTER — Ambulatory Visit: Payer: Self-pay

## 2024-11-07 ENCOUNTER — Encounter: Payer: Self-pay | Admitting: Internal Medicine

## 2024-11-07 ENCOUNTER — Ambulatory Visit: Admitting: Internal Medicine

## 2024-11-07 VITALS — BP 102/70 | HR 56 | Ht 65.0 in | Wt 118.0 lb

## 2024-11-07 DIAGNOSIS — Z1321 Encounter for screening for nutritional disorder: Secondary | ICD-10-CM | POA: Diagnosis not present

## 2024-11-07 DIAGNOSIS — M810 Age-related osteoporosis without current pathological fracture: Secondary | ICD-10-CM | POA: Diagnosis not present

## 2024-11-07 NOTE — Progress Notes (Signed)
 Patient Care Team: Perri Ronal PARAS, MD as PCP - General (Internal Medicine) Cathlyn JAYSON Nikki Bobie FORBES, MD as Consulting Physician (Obstetrics and Gynecology)  Visit Date: 11/07/24  Subjective:    Patient ID: [name] , Female   DOB: May 06, 1971, 53 y.o.    MRN: [id number]   54 y.o. Female presents today to discuss bone density results. Patient has a past medical history of anemia.  She was concerned about her bone density results. 10/29/2024 Bone density Lumbar spine BMD 0.876 T-score -2.5. She was open to starting Prolia injections. Last vitamin D  on 03/14/2021 was 40. She is currently taking 1000 units of Vitamin D3 daily and she is also taking calcium supplements.    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   Endometriosis    History of COVID-19 05/2021   Hypoglycemia    Infertility, female    Migraine    occ   Mitral valve prolapse    mild no cardiologist   PMB (postmenopausal bleeding) 05/05/2022   Urinary incontinence      Family History  Problem Relation Age of Onset   Asthma Mother    Cancer Father 42       prostate cancer   Hyperlipidemia Father    Prostate cancer Father    Aneurysm Sister        with brain aneurysm--doing well   Cerebral palsy Daughter    Breast cancer Maternal Aunt 37       A & W--BRCA neg   Diabetes Maternal Aunt    Stroke Paternal Grandmother    Aneurysm Paternal Aunt        dec brain aneurysm   Colon cancer Neg Hx    Colon polyps Neg Hx    Esophageal cancer Neg Hx    Rectal cancer Neg Hx    Stomach cancer Neg Hx    Social history Narrative:  She is married. 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 She does not smoke. Seldom consumes  alcohol. 2 daughters.      Review of Systems  All other systems reviewed and are negative.       Objective:   Vitals: BP 102/70   Pulse (!) 56   Ht 5' 5 (1.651 m)   Wt 118 lb (53.5 kg)   LMP 10/18/2016 (Approximate)   SpO2 98%   BMI 19.64 kg/m    Physical Exam Vitals and nursing note reviewed.       Results:    Labs:       Component Value Date/Time   NA 137 12/17/2023 1149   K 5.0 12/17/2023 1149   CL 102 12/17/2023 1149   CO2 26 12/17/2023 1149   GLUCOSE 87 12/17/2023 1149   BUN 12 12/17/2023 1149   CREATININE 0.75 12/17/2023 1149   CALCIUM 9.8 12/17/2023 1149   PROT 6.9 07/14/2024 1030   ALBUMIN 4.6 08/03/2016 1019   AST 32 07/14/2024 1030   ALT 42 (H) 07/14/2024 1030   ALKPHOS 65 08/03/2016 1019   BILITOT 0.6 07/14/2024 1030   GFRNONAA >60 05/11/2022 1338   GFRNONAA 84 03/14/2021 1119   GFRAA 97 03/14/2021 1119  Lab Results  Component Value Date   WBC 5.2 12/17/2023   HGB 13.8 12/17/2023   HCT 42.3 12/17/2023   MCV 92.8 12/17/2023   PLT 179 12/17/2023    Lab Results  Component Value Date   CHOL 192 12/17/2023   HDL 79 12/17/2023   LDLCALC 96 12/17/2023   TRIG 83 12/17/2023   CHOLHDL 2.4 12/17/2023    Lab Results  Component Value Date   HGBA1C 5.5 07/11/2011     Lab Results  Component Value Date   TSH 1.70 12/17/2023        Assessment & Plan:   Orders Placed This Encounter  Procedures   VITAMIN D  25 Hydroxy (Vit-D Deficiency, Fractures)    Encounter for Vitamin D  screening: She was concerned about her bone density results. 10/29/2024 Bone density Lumbar spine BMD 0.876 T-score -2.5. She was open to starting Prolia injections. Last vitamin D  on 03/14/2021 was 40. She is currently taking 1000 units of Vitamin D3 daily and she is also taking calcium supplements.    Vitamin D  Hydroxy ordered.   Referred to endocrinology.    I,Makayla C Reid,acting as a scribe for Ronal JINNY Hailstone, MD.,have documented all relevant  documentation on the behalf of Ronal JINNY Hailstone, MD,as directed by  Ronal JINNY Hailstone, MD while in the presence of Ronal JINNY Hailstone, MD.

## 2024-11-08 ENCOUNTER — Ambulatory Visit: Payer: Self-pay | Admitting: Internal Medicine

## 2024-11-08 LAB — VITAMIN D 25 HYDROXY (VIT D DEFICIENCY, FRACTURES): Vit D, 25-Hydroxy: 43 ng/mL (ref 30–100)

## 2024-11-08 NOTE — Patient Instructions (Signed)
 Referral to Endocrinology for consultation regarding therapy for osteoporosis. Patient would like to discuss options.

## 2024-11-19 NOTE — Progress Notes (Unsigned)
 GYNECOLOGY  VISIT   HPI: 53 y.o.   Married  Caucasian female   G2P2002 with Patient's last menstrual period was 10/18/2016.   here for: Spotting      GYNECOLOGIC HISTORY: Patient's last menstrual period was 10/18/2016. Contraception:  Vasectomy  Menopausal hormone therapy:  n/a Last 2 paps:  04/26/22 neg, HR HPV neg, 08/05/19 neg, HPV neg  History of abnormal Pap or positive HPV:  no Mammogram:  01/04/24 Breast Density Cat B, BIRADS Cat 1 neg         OB History     Gravida  2   Para  2   Term  2   Preterm      AB      Living  2      SAB      IAB      Ectopic      Multiple      Live Births                 Patient Active Problem List   Diagnosis Date Noted   Hypoglycemia    Impingement syndrome of right shoulder region 01/14/2019   Attention deficit 08/28/2011   Mitral valve prolapse 07/14/2011   Migraine headache 07/14/2011   Anxiety 07/14/2011    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   Endometriosis    History of COVID-19 05/2021   Hypoglycemia    Infertility, female    Migraine    occ   Mitral valve prolapse    mild no cardiologist   PMB (postmenopausal bleeding) 05/05/2022   Urinary incontinence     Past Surgical History:  Procedure Laterality Date   BARTHOLIN GLAND CYST EXCISION     drained per pt   BREAST REDUCTION SURGERY Bilateral 11/17/2022   Procedure: MAMMARY REDUCTION  (BREAST);  Surgeon: Arelia Filippo, MD;  Location: [facility];  Service: Plastics;  Laterality: Bilateral;   colonscopy  2021   DILATATION & CURETTAGE/HYSTEROSCOPY WITH MYOSURE N/A 05/15/2022   Procedure: DILATATION & CURETTAGE/HYSTEROSCOPY;  Surgeon: Cathlyn JAYSON Nikki Bobie FORBES, MD;  Location: [facility];  Service: Gynecology;  Laterality: N/A;   IVF     swdaton with egg retrieval    LAPAROSCOPIC ENDOMETRIOSIS FULGURATION  2000   PELVIC LAPAROSCOPY     REDUCTION MAMMAPLASTY Bilateral 11/03/2022   WISDOM TOOTH EXTRACTION      Current Outpatient Medications  Medication Sig Dispense Refill   Calcium Carbonate-Vit D-Min (CALCIUM 1200 PO) Take 1,200 mg by mouth daily.     melatonin 3 MG TABS tablet Take 3 mg by mouth at bedtime.     Multiple Vitamin (MULTIVITAMIN) tablet Take 1 tablet by mouth daily.     ondansetron  (ZOFRAN ) 4 MG tablet Take 1 tablet (4 mg total) by mouth every 8 (eight) hours as needed for nausea or vomiting. 20 tablet 0   VITAMIN D , CHOLECALCIFEROL, PO Take 1,000 Units by mouth daily.     No current facility-administered medications for this visit.     ALLERGIES: Sulfa antibiotics  Family History  Problem Relation Age of Onset   Asthma Mother    Cancer Father 59  prostate cancer   Hyperlipidemia Father    Prostate cancer Father    Aneurysm Sister        with brain aneurysm--doing well   Cerebral palsy Daughter    Breast cancer Maternal Aunt 31       A & W--BRCA neg   Diabetes Maternal Aunt    Stroke Paternal Grandmother    Aneurysm Paternal Aunt        dec brain aneurysm   Colon cancer Neg Hx    Colon polyps Neg Hx    Esophageal cancer Neg Hx    Rectal cancer Neg Hx    Stomach cancer Neg Hx     Social History   Socioeconomic History   Marital status: Married    Spouse name: Not on file   Number of children: Not on file   Years of education: Not on file   Highest education level: Not on file  Occupational History   Not on file  Tobacco Use   Smoking status: Never   Smokeless tobacco: Never  Vaping Use   Vaping status: Never Used  Substance and Sexual Activity   Alcohol use: Yes    Comment: occ. glass of wine   Drug use: No   Sexual activity: Yes    Partners: Male    Birth control/protection: Other-see comments    Comment:  partner with vasectomy  Other Topics Concern   Not on file  Social History Narrative   Not on file   Social Drivers of Health   Tobacco Use: Low Risk (11/07/2024)   Patient History    Smoking Tobacco Use: Never    Smokeless Tobacco Use: Never    Passive Exposure: Not on file  Financial Resource Strain: Not on file  Food Insecurity: Not on file  Transportation Needs: Not on file  Physical Activity: Not on file  Stress: Not on file  Social Connections: Not on file  Intimate Partner Violence: Not on file  Depression (PHQ2-9): Low Risk (12/18/2023)   Depression (PHQ2-9)    PHQ-2 Score: 0  Alcohol Screen: Not on file  Housing: Not on file  Utilities: Not on file  Health Literacy: Not on file    Review of Systems  PHYSICAL EXAMINATION:   LMP 10/18/2016     General appearance: alert, cooperative and appears stated age Head: Normocephalic, without obvious abnormality, atraumatic Neck: no adenopathy, supple, symmetrical, trachea midline and thyroid normal to inspection and palpation Lungs: clear to auscultation bilaterally Breasts: normal appearance, no masses or tenderness, No nipple retraction or dimpling, No nipple discharge or bleeding, No axillary or supraclavicular adenopathy Heart: regular rate and rhythm Abdomen: soft, non-tender, no masses,  no organomegaly Extremities: extremities normal, atraumatic, no cyanosis or edema Skin: Skin color, texture, turgor normal. No rashes or lesions Lymph nodes: Cervical, supraclavicular, and axillary nodes normal. No abnormal inguinal nodes palpated Neurologic: Grossly normal  Pelvic: External genitalia:  no lesions              Urethra:  normal appearing urethra with no masses, tenderness or lesions              Bartholins and Skenes: normal                 Vagina: normal appearing vagina with normal color and discharge, no lesions              Cervix: no lesions  Bimanual Exam:  Uterus:  normal size, contour,  position, consistency, mobility, non-tender              Adnexa: no mass, fullness, tenderness              Rectal exam: {yes no:314532}.  Confirms.              Anus:  normal sphincter tone, no lesions  Chaperone was present for exam:  {BSCHAPERONE:31226::Yena F, CMA}  ASSESSMENT:    PLAN:    {LABS (Optional):23779}  ***  total time was spent for this patient encounter, including preparation, face-to-face counseling with the patient, coordination of care, and documentation of the encounter.

## 2024-11-20 ENCOUNTER — Other Ambulatory Visit (HOSPITAL_COMMUNITY)
Admission: RE | Admit: 2024-11-20 | Discharge: 2024-11-20 | Disposition: A | Source: Ambulatory Visit | Attending: Obstetrics and Gynecology | Admitting: Obstetrics and Gynecology

## 2024-11-20 ENCOUNTER — Ambulatory Visit: Admitting: Obstetrics and Gynecology

## 2024-11-20 ENCOUNTER — Encounter: Payer: Self-pay | Admitting: Obstetrics and Gynecology

## 2024-11-20 VITALS — BP 108/60 | HR 67

## 2024-11-20 DIAGNOSIS — N952 Postmenopausal atrophic vaginitis: Secondary | ICD-10-CM

## 2024-11-20 DIAGNOSIS — Z124 Encounter for screening for malignant neoplasm of cervix: Secondary | ICD-10-CM

## 2024-11-20 DIAGNOSIS — N950 Postmenopausal bleeding: Secondary | ICD-10-CM

## 2024-11-20 NOTE — Patient Instructions (Addendum)
 Vaginal Dryness and Thinning (Atrophic Vaginitis): What to Know  Atrophic vaginitis is when the lining of the vagina becomes dry, thin, and inflamed. Tell your doctor if you notice any changes in your vagina. They can help you find ways to feel better. They can also treat infections and suggest healthy habits for a healthy vagina. What are the causes? This condition is caused by a drop in estrogen. This affects the moisture in the lining of your vagina. When estrogen levels are low, the tissues become dry. This is most common when menstrual periods stop during menopause. What increases the risk? You're more likely to get this condition if: You take medicines that block estrogen. You've had your ovaries taken out. You're being treated for cancer. You recently had a baby. You're breastfeeding. You're over 55 years old. You have an eating disorder. You smoke. What are the signs or symptoms? Pain during sex. Soreness during sex. Bleeding during sex. Burning or itching around your vagina. Loss of interest in sex. Burning pain when you pee. Peeing often. Fluid coming from your vagina that isn't normal. It may be: White. [place]. Yellow. Tinted with blood. Thick. Watery. Some people don't have symptoms. How is this treated? Using a lubricant before sex. Using a moisturizer in the vagina. Using estrogen in the vagina. You may not need treatment if your symptoms are mild or you're not having sex. Follow these instructions at home: Medicines Take your medicines only as told. Do not use herbal or other medicines unless your doctor says it's safe. Use creams, lubricants, or moisturizers only as told. General instructions Talk with your doctor about any menopause symptoms and treatment options. Do not douche. Do not use scented: Sprays. Tampons. Soaps. If sex hurts, try using lubricants right before you have sex. Contact a doctor if: You have fluid coming from the vagina that's not  normal. You have a smell coming from your vagina. You have new symptoms. Your symptoms don't get better with treatment. Your symptoms get worse. This information is not intended to replace advice given to you by your health care provider. Make sure you discuss any questions you have with your health care provider. Document Revised: 07/02/2023 Document Reviewed: 07/02/2023 Elsevier Patient Education  2024 [employer]  Uterine Bleeding After Menopause: What It Means Menopause is the end of a female's fertile years. After menopause, your ovaries can no longer release an egg. You'll no longer be able to get pregnant, and you'll no longer get a period. Any type of bleeding after menopause should be checked by a health care provider, as this is not normal. Treatment will depend on the cause of the bleeding. What can cause bleeding after menopause? Your provider may do tests to find out why you're bleeding. You may have bleeding if: You take hormones to treat the symptoms of menopause. The lining of your uterus thins as a result of low estrogen. The lining of your uterus thickens as a result of too much estrogen. You have cancer in the uterus. You have growths in the uterus, such as: Polyps. Fibroids. Follow these instructions at home: Pay attention to any changes in your symptoms. Let your provider know about them. If told by your provider: Avoid using tampons and douches. Get regular pelvic exams, including Pap tests. Take iron supplements. Change your pads regularly. Take your medicines only as told. Contact a health care provider if: You have pain in your belly. You have headaches. You have a fever or chills. You feel  faint or dizzy. Get help right away if: You pass large blood clots. You have heavy bleeding that needs more than 1 pad an hour and you've never had this before. This information is not intended to replace advice given to you by your health care provider. Make sure  you discuss any questions you have with your health care provider. Document Revised: 10/02/2023 Document Reviewed: 07/30/2023 Elsevier Patient Education  2025 [employer].

## 2024-11-25 LAB — CYTOLOGY - PAP
Comment: NEGATIVE
Diagnosis: NEGATIVE
High risk HPV: NEGATIVE

## 2024-11-28 ENCOUNTER — Ambulatory Visit: Payer: Self-pay | Admitting: Obstetrics and Gynecology
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
## 2024-12-18 ENCOUNTER — Other Ambulatory Visit: Payer: BC Managed Care – PPO

## 2024-12-18 DIAGNOSIS — Z0000 Encounter for general adult medical examination without abnormal findings: Secondary | ICD-10-CM

## 2024-12-18 DIAGNOSIS — M810 Age-related osteoporosis without current pathological fracture: Secondary | ICD-10-CM

## 2024-12-18 DIAGNOSIS — Z13220 Encounter for screening for lipoid disorders: Secondary | ICD-10-CM

## 2024-12-18 DIAGNOSIS — Z1329 Encounter for screening for other suspected endocrine disorder: Secondary | ICD-10-CM

## 2024-12-19 ENCOUNTER — Ambulatory Visit: Payer: BC Managed Care – PPO | Admitting: Internal Medicine

## 2024-12-19 ENCOUNTER — Encounter: Payer: Self-pay | Admitting: Internal Medicine

## 2024-12-19 VITALS — BP 102/70 | HR 66 | Ht 65.0 in | Wt 121.0 lb

## 2024-12-19 DIAGNOSIS — M818 Other osteoporosis without current pathological fracture: Secondary | ICD-10-CM | POA: Diagnosis not present

## 2024-12-19 DIAGNOSIS — R748 Abnormal levels of other serum enzymes: Secondary | ICD-10-CM

## 2024-12-19 DIAGNOSIS — Z0000 Encounter for general adult medical examination without abnormal findings: Secondary | ICD-10-CM

## 2024-12-19 LAB — CBC WITH DIFFERENTIAL/PLATELET
Absolute Lymphocytes: 2074 {cells}/uL (ref 850–3900)
Absolute Monocytes: 325 {cells}/uL (ref 200–950)
Basophils Absolute: 28 {cells}/uL (ref 0–200)
Basophils Relative: 0.5 %
Eosinophils Absolute: 72 {cells}/uL (ref 15–500)
Eosinophils Relative: 1.3 %
HCT: 42.2 % (ref 35.9–46.0)
Hemoglobin: 14 g/dL (ref 11.7–15.5)
MCH: 31.3 pg (ref 27.0–33.0)
MCHC: 33.2 g/dL (ref 31.6–35.4)
MCV: 94.2 fL (ref 81.4–101.7)
MPV: 11.6 fL (ref 7.5–12.5)
Monocytes Relative: 5.9 %
Neutro Abs: 3003 {cells}/uL (ref 1500–7800)
Neutrophils Relative %: 54.6 %
Platelets: 185 Thousand/uL (ref 140–400)
RBC: 4.48 Million/uL (ref 3.80–5.10)
RDW: 12 % (ref 11.0–15.0)
Total Lymphocyte: 37.7 %
WBC: 5.5 Thousand/uL (ref 3.8–10.8)

## 2024-12-19 LAB — LIPID PANEL
Cholesterol: 191 mg/dL
HDL: 78 mg/dL
LDL Cholesterol (Calc): 94 mg/dL
Non-HDL Cholesterol (Calc): 113 mg/dL
Total CHOL/HDL Ratio: 2.4 (calc)
Triglycerides: 92 mg/dL

## 2024-12-19 LAB — COMPREHENSIVE METABOLIC PANEL WITH GFR
AG Ratio: 2.4 (calc) (ref 1.0–2.5)
ALT: 39 U/L — ABNORMAL HIGH (ref 6–29)
AST: 32 U/L (ref 10–35)
Albumin: 4.7 g/dL (ref 3.6–5.1)
Alkaline phosphatase (APISO): 73 U/L (ref 37–153)
BUN: 13 mg/dL (ref 7–25)
CO2: 28 mmol/L (ref 20–32)
Calcium: 9.4 mg/dL (ref 8.6–10.4)
Chloride: 102 mmol/L (ref 98–110)
Creat: 0.64 mg/dL (ref 0.50–1.03)
Globulin: 2 g/dL (ref 1.9–3.7)
Glucose, Bld: 86 mg/dL (ref 65–99)
Potassium: 4.7 mmol/L (ref 3.5–5.3)
Sodium: 137 mmol/L (ref 135–146)
Total Bilirubin: 0.5 mg/dL (ref 0.2–1.2)
Total Protein: 6.7 g/dL (ref 6.1–8.1)
eGFR: 104 mL/min/1.73m2

## 2024-12-19 LAB — TSH: TSH: 1.59 m[IU]/L

## 2024-12-19 LAB — POCT URINALYSIS DIP (CLINITEK)
Bilirubin, UA: NEGATIVE
Blood, UA: NEGATIVE
Glucose, UA: NEGATIVE mg/dL
Ketones, POC UA: NEGATIVE mg/dL
Leukocytes, UA: NEGATIVE
Nitrite, UA: NEGATIVE
POC PROTEIN,UA: NEGATIVE
Spec Grav, UA: 1.01
Urobilinogen, UA: 0.2 U/dL
pH, UA: 6.5

## 2024-12-22 NOTE — Progress Notes (Unsigned)
 "  GYNECOLOGY  VISIT   HPI: 54 y.o.   Married  Caucasian female   G2P2002 with Patient's last menstrual period was 10/18/2016.   here for: U/S Consult with possible endometrial biopsy      GYNECOLOGIC HISTORY: Patient's last menstrual period was 10/18/2016. Contraception:    Menopausal hormone therapy:  n/a Last 2 paps:  11/20/24 neg, HR HPV neg, 04/26/22 neg HR HPV neg History of abnormal Pap or positive HPV:  no Mammogram:  01/04/24 Breast Density Cat B, BIRADS Cat 1 neg         OB History     Gravida  2   Para  2   Term  2   Preterm      AB      Living  2      SAB      IAB      Ectopic      Multiple      Live Births                 Patient Active Problem List   Diagnosis Date Noted   Hypoglycemia    Impingement syndrome of right shoulder region 01/14/2019   Attention deficit 08/28/2011   Mitral valve prolapse 07/14/2011   Migraine headache 07/14/2011   Anxiety 07/14/2011    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   Endometriosis    History of COVID-19 05/2021   Hypoglycemia    Infertility, female    Migraine    occ   Mitral valve prolapse    mild no cardiologist   PMB (postmenopausal bleeding) 05/05/2022   Urinary incontinence     Past Surgical History:  Procedure Laterality Date   BARTHOLIN GLAND CYST EXCISION     drained per pt   BREAST REDUCTION SURGERY Bilateral 11/17/2022   Procedure: MAMMARY REDUCTION  (BREAST);  Surgeon: Arelia Filippo, MD;  Location: [facility];  Service: Plastics;  Laterality: Bilateral;   colonscopy  2021   DILATATION & CURETTAGE/HYSTEROSCOPY WITH MYOSURE N/A 05/15/2022   Procedure: DILATATION & CURETTAGE/HYSTEROSCOPY;  Surgeon: Cathlyn JAYSON Nikki Bobie FORBES, MD;   Location: [facility];  Service: Gynecology;  Laterality: N/A;   IVF     swdaton with egg retrieval    LAPAROSCOPIC ENDOMETRIOSIS FULGURATION  2000   PELVIC LAPAROSCOPY     REDUCTION MAMMAPLASTY Bilateral 11/03/2022   WISDOM TOOTH EXTRACTION      Current Outpatient Medications  Medication Sig Dispense Refill   Calcium Carbonate-Vit D-Min (CALCIUM 1200 PO) Take 1,200 mg by mouth daily.     melatonin 3 MG TABS tablet Take 3 mg by mouth at bedtime.     Multiple Vitamin (MULTIVITAMIN) tablet Take 1 tablet by mouth daily.     ondansetron  (ZOFRAN ) 4 MG tablet Take 1 tablet (4 mg total) by mouth every 8 (eight) hours as needed for nausea or vomiting. 20 tablet 0   VITAMIN D , CHOLECALCIFEROL, PO Take 1,000 Units by mouth daily.     No current facility-administered medications for this visit.     ALLERGIES: Sulfa antibiotics  Family History  Problem Relation Age of Onset   Asthma Mother    Cancer Father 44  prostate cancer   Hyperlipidemia Father    Prostate cancer Father    Aneurysm Sister        with brain aneurysm--doing well   Cerebral palsy Daughter    Breast cancer Maternal Aunt 92       A & W--BRCA neg   Diabetes Maternal Aunt    Stroke Paternal Grandmother    Aneurysm Paternal Aunt        dec brain aneurysm   Colon cancer Neg Hx    Colon polyps Neg Hx    Esophageal cancer Neg Hx    Rectal cancer Neg Hx    Stomach cancer Neg Hx     Social History   Socioeconomic History   Marital status: Married    Spouse name: Not on file   Number of children: Not on file   Years of education: Not on file   Highest education level: Not on file  Occupational History   Not on file  Tobacco Use   Smoking status: Never   Smokeless tobacco: Never  Vaping Use   Vaping status: Never Used  Substance and Sexual Activity   Alcohol use: Yes    Comment: occ. glass of wine   Drug use: No   Sexual activity: Yes    Partners: Male    Birth control/protection:  Other-see comments    Comment: partner with vasectomy  Other Topics Concern   Not on file  Social History Narrative   Not on file   Social Drivers of Health   Tobacco Use: Low Risk (12/19/2024)   Patient History    Smoking Tobacco Use: Never    Smokeless Tobacco Use: Never    Passive Exposure: Not on file  Financial Resource Strain: Not on file  Food Insecurity: Not on file  Transportation Needs: Not on file  Physical Activity: Not on file  Stress: Not on file  Social Connections: Not on file  Intimate Partner Violence: Not on file  Depression (PHQ2-9): Low Risk (12/19/2024)   Depression (PHQ2-9)    PHQ-2 Score: 3  Alcohol Screen: Not on file  Housing: Not on file  Utilities: Not on file  Health Literacy: Not on file    Review of Systems  PHYSICAL EXAMINATION:   LMP 10/18/2016     General appearance: alert, cooperative and appears stated age Head: Normocephalic, without obvious abnormality, atraumatic Neck: no adenopathy, supple, symmetrical, trachea midline and thyroid normal to inspection and palpation Lungs: clear to auscultation bilaterally Breasts: normal appearance, no masses or tenderness, No nipple retraction or dimpling, No nipple discharge or bleeding, No axillary or supraclavicular adenopathy Heart: regular rate and rhythm Abdomen: soft, non-tender, no masses,  no organomegaly Extremities: extremities normal, atraumatic, no cyanosis or edema Skin: Skin color, texture, turgor normal. No rashes or lesions Lymph nodes: Cervical, supraclavicular, and axillary nodes normal. No abnormal inguinal nodes palpated Neurologic: Grossly normal  Pelvic: External genitalia:  no lesions              Urethra:  normal appearing urethra with no masses, tenderness or lesions              Bartholins and Skenes: normal                 Vagina: normal appearing vagina with normal color and discharge, no lesions              Cervix: no lesions  Bimanual Exam:   Uterus:  normal size, contour, position, consistency, mobility, non-tender              Adnexa: no mass, fullness, tenderness              Rectal exam: {yes no:314532}.  Confirms.              Anus:  normal sphincter tone, no lesions  Chaperone was present for exam:  {BSCHAPERONE:31226::Aaron F, CMA}  ASSESSMENT:    PLAN:    {LABS (Optional):23779}  ***  total time was spent for this patient encounter, including preparation, face-to-face counseling with the patient, coordination of care, and documentation of the encounter.    "

## 2024-12-23 ENCOUNTER — Ambulatory Visit

## 2024-12-23 ENCOUNTER — Encounter: Payer: Self-pay | Admitting: Obstetrics and Gynecology

## 2024-12-23 ENCOUNTER — Ambulatory Visit: Admitting: Obstetrics and Gynecology

## 2024-12-23 VITALS — BP 116/64 | HR 61

## 2024-12-23 DIAGNOSIS — N952 Postmenopausal atrophic vaginitis: Secondary | ICD-10-CM

## 2024-12-23 DIAGNOSIS — N950 Postmenopausal bleeding: Secondary | ICD-10-CM

## 2024-12-23 NOTE — Patient Instructions (Signed)
 Consider vaginal vitamin E over the counter from [employer] or as a compounded prescription.    Prasterone Vaginal insert What is this medication? PRASTERONE (PRAS ter one), also known as DEHYDROEPIANDROSTERONE (DHEA) reduces vaginal pain during sex due to menopause. It works by increasing levels of the hormone estrogen in the body. This medicine may be used for other purposes; ask your health care provider or pharmacist if you have questions. COMMON BRAND NAME(S): INTRAROSA What should I tell my care team before I take this medication? They need to know if you have any of these conditions: Cancer, such as breast, uterine, or other cancer History of vaginal bleeding An unusual or allergic reaction to prasterone, DHEA, other hormones, medications, foods, dyes, or preservatives Pregnant or trying to get pregnant Breast-feeding How should I use this medication? This medication is for vaginal use only. Do not take by mouth. Follow the directions on the prescription label. Read package directions carefully before using. Wash hands before and after use. Use this medication at bedtime. Do not use it more often than directed. Do not stop using except on your care team's advice. Talk to your care team about the use of this medication in children. This medication is not approved for use in children. Overdosage: If you think you have taken too much of this medicine contact a poison control center or emergency room at once. NOTE: This medicine is only for you. Do not share this medicine with others. What if I miss a dose? If you miss a dose, use it as soon as you can. If it is almost time for your next dose, use only that dose. Do not use double or extra doses. What may interact with this medication? Interactions are not expected. Do not use any other vaginal products without telling your care team. This list may not describe all possible interactions. Give your health care provider a list of all the  medicines, herbs, non-prescription drugs, or dietary supplements you use. Also tell them if you smoke, drink alcohol, or use illegal drugs. Some items may interact with your medicine. What should I watch for while using this medication? Visit your care team for a regular check on your progress. This medication may cause changes on a cervical Pap smear. You will receive regular pelvic exams. What side effects may I notice from receiving this medication? Side effects that you should report to your care team as soon as possible: Allergic reactions--skin rash, itching, hives, swelling of the face, lips, tongue, or throat Unusual vaginal discharge, itching, or odor Vaginal bleeding after menopause, pelvic pain Side effects that usually do not require medical attention (report to your care team if they continue or are bothersome): Vaginal discharge Vaginal irritation at application site This list may not describe all possible side effects. Call your doctor for medical advice about side effects. You may report side effects to FDA at 1-800-FDA-1088. Where should I keep my medication? Keep out of the reach of children and pets. Store at room temperature or in a refrigerator between 5 and 30 degrees C (41 and 86 degrees F). Throw away any unused medication after the expiration date. NOTE: This sheet is a summary. It may not cover all possible information. If you have questions about this medicine, talk to your doctor, pharmacist, or health care provider.  2024 Elsevier/Gold Standard (2021-10-12 00:00:00)  Estradiol Vaginal insert What is this medication? ESTRADIOL (es tra DYE ole) relieves the symptoms of menopause, such as vaginal irritation, dryness, or pain  during sex. It works by increasing levels of the hormone estrogen in the body. It is an estrogen hormone. This medicine may be used for other purposes; ask your health care provider or pharmacist if you have questions. COMMON BRAND NAME(S):  Imvexxy, Vagifem, Yuvafem What should I tell my care team before I take this medication? They need to know if you have any of these conditions: Asthma Blood clotting disorder or history of blood clots Cancer, such as breast, cervical, or liver cancer Diabetes Gallbladder disease Having surgery Heart or blood vessel conditions Hereditary angioedema, a genetic condition that causes episodes of severe swelling High blood pressure High cholesterol High levels of calcium in your blood History of heart attack History of stroke Hysterectomy Kidney disease Liver disease Lupus Migraine or other severe headaches Porphyria Seizures Thyroid disease Tobacco use Unusual vaginal bleeding An unusual or allergic reaction to estrogens, other medications, foods, dyes, or preservatives Pregnant or trying to get pregnant Breastfeeding How should I use this medication? This medication is for vaginal use only. Do not take by mouth. Follow the directions that are included with your prescription. Wash hands before and after use. Keep using this medication unless your care team tells you to stop. This medication comes with INSTRUCTIONS FOR USE. Ask your pharmacist for directions on how to use this medication. Read the information carefully. Talk to your pharmacist or care team if you have questions. A patient package insert for the product will be given with each prescription and refill. Read this sheet carefully each time. The sheet may change frequently. Contact your care team about the use of this medication in children. Special care may be needed. Overdosage: If you think you have taken too much of this medicine contact a poison control center or emergency room at once. NOTE: This medicine is only for you. Do not share this medicine with others. What if I miss a dose? If you miss a dose, use it as soon as you can. If it is almost time for your next dose, use only that dose. Do not use double or extra  doses. What may interact with this medication? This medication may affect how other medications work, and other medications may affect the way this medication works. Talk with your care team about all of the medications you take. They may suggest changes to your treatment plan to lower the risk of side effects and to make sure your medications work as intended. This list may not describe all possible interactions. Give your health care provider a list of all the medicines, herbs, non-prescription drugs, or dietary supplements you use. Also tell them if you smoke, drink alcohol, or use illegal drugs. Some items may interact with your medicine. What should I watch for while using this medication? Visit your care team for regular checks on your progress. Tell your care team if your symptoms do not start to get better or if they get worse. Talk to your care team about how often you should have a pelvic exam, breast exam, and a mammogram. Talk to your care team about your risk of cancer. You may be more at risk for certain types of cancer if you take this medication. Talk to your care team right away if you have vaginal bleeding while on this medication. If you have a uterus, talk to your care team about whether adding a progestin to your hormone therapy is right for you. Taking progestins with estrogen therapy may lower the risk of uterine cancer,  but can have other health risks. This medication can increase the risk of serious blood clots, which can cause a heart attack or stroke. The risk increases if you are older than 56 years of age or use tobacco. Talk to your care team if you use tobacco products. This medication can cause dry eyes or a slight change in the shape of the eye. This can affect how contact lenses fit and feel. If you wear contact lenses, talk to your care team if you have eye discomfort or vision changes. They can help you find a solution, such as a change in contact lens or eye drops for  dryness. If you are going to need surgery or other procedure, tell your care team that you are using this medication. If you may be pregnant, stop taking this medication right away and contact your care team. What side effects may I notice from receiving this medication? Side effects that you should report to your care team as soon as possible: Allergic reactions or angioedema--skin rash, itching or hives, swelling of the face, eyes, lips, tongue, arms, or legs, trouble swallowing or breathing Blood clot--pain, swelling, or warmth in the leg, shortness of breath, chest pain Breast tissue changes, new lumps, redness, pain, or discharge from the nipple Change in vision Gallbladder problems--severe stomach pain, nausea, vomiting, fever Increase in blood pressure Liver injury--right upper belly pain, loss of appetite, nausea, light-colored stool, dark yellow or brown urine, yellowing skin or eyes, unusual weakness or fatigue Stroke--sudden numbness or weakness of the face, arm, or leg, trouble speaking, confusion, trouble walking, loss of balance or coordination, dizziness, severe headache, change in vision Unusual vaginal discharge, itching, or odor Vaginal bleeding after menopause, pelvic pain Side effects that usually do not require medical attention (report these to your care team if they continue or are bothersome): Bloating Breast pain or tenderness Hair loss Nausea Stomach pain Swelling of the ankles, hands, or feet Vaginal irritation at application site This list may not describe all possible side effects. Call your doctor for medical advice about side effects. You may report side effects to FDA at 1-800-FDA-1088. Where should I keep my medication? Keep out of the reach of children and pets. Store at room temperature between 20 and 25 degrees C (68 and 77 degrees F). Get rid of any unused medication after the expiration date. To get rid of medications that are no longer needed or have  expired: Take the medication to a medication take-back program. Check with your pharmacy or law enforcement to find a location. If you cannot return the medication, ask your pharmacist or care team how to get rid of this medication safely. NOTE: This sheet is a summary. It may not cover all possible information. If you have questions about this medicine, talk to your doctor, pharmacist, or health care provider.  2025 Elsevier/Gold Standard (2023-12-18 00:00:00)

## 2024-12-30 NOTE — Patient Instructions (Signed)
 Will be referred to [facility] regarding ALT elevation. Will be seeing Endocrinologist regard osteoporosis options.

## 2025-03-02 ENCOUNTER — Ambulatory Visit: Admitting: Internal Medicine

## 2025-04-08 ENCOUNTER — Ambulatory Visit: Admitting: Obstetrics and Gynecology
